# Patient Record
Sex: Female | Born: 1970 | Race: White | Hispanic: No | Smoking: Current every day smoker
Health system: Southern US, Community
[De-identification: ages and names within clinical notes are randomized; demographics above are authoritative.]

## PROBLEM LIST (undated history)

## (undated) ENCOUNTER — Emergency Department (HOSPITAL_COMMUNITY): Disposition: A | Discharge status: Home Health | Payer: Medicare Other

## (undated) ENCOUNTER — Inpatient Hospital Stay (HOSPITAL_COMMUNITY): Payer: Self-pay

## (undated) DIAGNOSIS — F419 Anxiety disorder, unspecified: Secondary | ICD-10-CM

## (undated) DIAGNOSIS — B169 Acute hepatitis B without delta-agent and without hepatic coma: Secondary | ICD-10-CM

## (undated) DIAGNOSIS — IMO0002 Reserved for concepts with insufficient information to code with codable children: Secondary | ICD-10-CM

## (undated) DIAGNOSIS — B1710 Acute hepatitis C without hepatic coma: Secondary | ICD-10-CM

## (undated) DIAGNOSIS — J449 Chronic obstructive pulmonary disease, unspecified: Secondary | ICD-10-CM

## (undated) HISTORY — PX: BREAST SURGERY: SHX581

---

## 1997-10-22 ENCOUNTER — Inpatient Hospital Stay (HOSPITAL_COMMUNITY): Admission: AD | Admit: 1997-10-22 | Discharge: 1997-10-22 | Payer: Self-pay | Admitting: *Deleted

## 1998-06-09 ENCOUNTER — Emergency Department (HOSPITAL_COMMUNITY): Admission: EM | Admit: 1998-06-09 | Discharge: 1998-06-09 | Payer: Self-pay | Admitting: Emergency Medicine

## 1998-06-09 ENCOUNTER — Encounter: Payer: Self-pay | Admitting: Emergency Medicine

## 1998-06-09 ENCOUNTER — Ambulatory Visit (HOSPITAL_COMMUNITY): Admission: RE | Admit: 1998-06-09 | Discharge: 1998-06-09 | Payer: Self-pay

## 1998-06-22 ENCOUNTER — Encounter: Payer: Self-pay | Admitting: Emergency Medicine

## 1998-06-22 ENCOUNTER — Inpatient Hospital Stay (HOSPITAL_COMMUNITY): Admission: EM | Admit: 1998-06-22 | Discharge: 1998-06-23 | Payer: Self-pay | Admitting: Emergency Medicine

## 1998-09-08 ENCOUNTER — Emergency Department (HOSPITAL_COMMUNITY): Admission: EM | Admit: 1998-09-08 | Discharge: 1998-09-08 | Payer: Self-pay | Admitting: Emergency Medicine

## 1998-10-29 ENCOUNTER — Inpatient Hospital Stay (HOSPITAL_COMMUNITY): Admission: EM | Admit: 1998-10-29 | Discharge: 1998-11-03 | Payer: Self-pay | Admitting: Emergency Medicine

## 1999-01-25 ENCOUNTER — Observation Stay (HOSPITAL_COMMUNITY): Admission: EM | Admit: 1999-01-25 | Discharge: 1999-01-25 | Payer: Self-pay | Admitting: *Deleted

## 2000-02-03 ENCOUNTER — Emergency Department (HOSPITAL_COMMUNITY): Admission: EM | Admit: 2000-02-03 | Discharge: 2000-02-03 | Payer: Self-pay | Admitting: Emergency Medicine

## 2001-03-11 ENCOUNTER — Other Ambulatory Visit: Admission: RE | Admit: 2001-03-11 | Discharge: 2001-03-11 | Payer: Self-pay | Admitting: Obstetrics and Gynecology

## 2001-03-12 ENCOUNTER — Other Ambulatory Visit: Admission: RE | Admit: 2001-03-12 | Discharge: 2001-03-12 | Payer: Self-pay | Admitting: Obstetrics and Gynecology

## 2001-03-28 ENCOUNTER — Encounter: Payer: Self-pay | Admitting: Obstetrics and Gynecology

## 2001-03-28 ENCOUNTER — Inpatient Hospital Stay (HOSPITAL_COMMUNITY): Admission: AD | Admit: 2001-03-28 | Discharge: 2001-03-28 | Payer: Self-pay | Admitting: Obstetrics and Gynecology

## 2001-06-26 ENCOUNTER — Encounter: Payer: Self-pay | Admitting: Obstetrics and Gynecology

## 2001-06-26 ENCOUNTER — Inpatient Hospital Stay (HOSPITAL_COMMUNITY): Admission: AD | Admit: 2001-06-26 | Discharge: 2001-06-26 | Payer: Self-pay | Admitting: Obstetrics and Gynecology

## 2001-08-25 ENCOUNTER — Inpatient Hospital Stay (HOSPITAL_COMMUNITY): Admission: AD | Admit: 2001-08-25 | Discharge: 2001-08-25 | Payer: Self-pay | Admitting: Obstetrics & Gynecology

## 2001-09-18 ENCOUNTER — Inpatient Hospital Stay (HOSPITAL_COMMUNITY): Admission: AD | Admit: 2001-09-18 | Discharge: 2001-09-21 | Payer: Self-pay | Admitting: Obstetrics and Gynecology

## 2001-09-25 ENCOUNTER — Inpatient Hospital Stay (HOSPITAL_COMMUNITY): Admission: AD | Admit: 2001-09-25 | Discharge: 2001-09-25 | Payer: Self-pay | Admitting: Family Medicine

## 2001-10-15 ENCOUNTER — Other Ambulatory Visit: Admission: RE | Admit: 2001-10-15 | Discharge: 2001-10-15 | Payer: Self-pay | Admitting: Obstetrics and Gynecology

## 2002-01-17 ENCOUNTER — Inpatient Hospital Stay (HOSPITAL_COMMUNITY): Admission: AD | Admit: 2002-01-17 | Discharge: 2002-01-18 | Payer: Self-pay | Admitting: *Deleted

## 2002-01-27 ENCOUNTER — Emergency Department (HOSPITAL_COMMUNITY): Admission: EM | Admit: 2002-01-27 | Discharge: 2002-01-27 | Payer: Self-pay | Admitting: Emergency Medicine

## 2002-03-26 ENCOUNTER — Emergency Department (HOSPITAL_COMMUNITY): Admission: EM | Admit: 2002-03-26 | Discharge: 2002-03-27 | Payer: Self-pay | Admitting: Emergency Medicine

## 2002-03-27 ENCOUNTER — Encounter: Payer: Self-pay | Admitting: Emergency Medicine

## 2002-09-07 ENCOUNTER — Encounter: Payer: Self-pay | Admitting: Obstetrics and Gynecology

## 2002-09-07 ENCOUNTER — Inpatient Hospital Stay (HOSPITAL_COMMUNITY): Admission: AD | Admit: 2002-09-07 | Discharge: 2002-09-07 | Payer: Self-pay | Admitting: Obstetrics and Gynecology

## 2002-09-25 ENCOUNTER — Emergency Department (HOSPITAL_COMMUNITY): Admission: EM | Admit: 2002-09-25 | Discharge: 2002-09-25 | Payer: Self-pay | Admitting: Emergency Medicine

## 2002-09-27 ENCOUNTER — Inpatient Hospital Stay (HOSPITAL_COMMUNITY): Admission: AD | Admit: 2002-09-27 | Discharge: 2002-09-27 | Payer: Self-pay | Admitting: Psychiatry

## 2002-10-24 ENCOUNTER — Emergency Department (HOSPITAL_COMMUNITY): Admission: AC | Admit: 2002-10-24 | Discharge: 2002-10-24 | Payer: Self-pay

## 2002-10-24 ENCOUNTER — Encounter: Payer: Self-pay | Admitting: Emergency Medicine

## 2003-09-14 ENCOUNTER — Emergency Department (HOSPITAL_COMMUNITY): Admission: EM | Admit: 2003-09-14 | Discharge: 2003-09-14 | Payer: Self-pay | Admitting: Emergency Medicine

## 2004-01-11 ENCOUNTER — Inpatient Hospital Stay (HOSPITAL_COMMUNITY): Admission: EM | Admit: 2004-01-11 | Discharge: 2004-01-13 | Payer: Self-pay | Admitting: Psychiatry

## 2004-01-11 ENCOUNTER — Ambulatory Visit: Payer: Self-pay | Admitting: Psychiatry

## 2004-01-12 ENCOUNTER — Encounter: Payer: Self-pay | Admitting: Emergency Medicine

## 2004-03-11 ENCOUNTER — Emergency Department (HOSPITAL_COMMUNITY): Admission: EM | Admit: 2004-03-11 | Discharge: 2004-03-11 | Payer: Self-pay | Admitting: Emergency Medicine

## 2004-03-19 ENCOUNTER — Ambulatory Visit (HOSPITAL_COMMUNITY): Admission: AD | Admit: 2004-03-19 | Discharge: 2004-03-19 | Payer: Self-pay | Admitting: Obstetrics and Gynecology

## 2004-03-19 ENCOUNTER — Encounter (INDEPENDENT_AMBULATORY_CARE_PROVIDER_SITE_OTHER): Payer: Self-pay | Admitting: Specialist

## 2004-03-22 ENCOUNTER — Inpatient Hospital Stay (HOSPITAL_COMMUNITY): Admission: AD | Admit: 2004-03-22 | Discharge: 2004-03-22 | Payer: Self-pay | Admitting: Obstetrics and Gynecology

## 2004-08-11 ENCOUNTER — Inpatient Hospital Stay (HOSPITAL_COMMUNITY): Admission: EM | Admit: 2004-08-11 | Discharge: 2004-08-19 | Payer: Self-pay | Admitting: Emergency Medicine

## 2004-08-13 ENCOUNTER — Ambulatory Visit: Payer: Self-pay | Admitting: Internal Medicine

## 2004-08-21 ENCOUNTER — Emergency Department (HOSPITAL_COMMUNITY): Admission: EM | Admit: 2004-08-21 | Discharge: 2004-08-22 | Payer: Self-pay | Admitting: Emergency Medicine

## 2004-09-13 ENCOUNTER — Emergency Department (HOSPITAL_COMMUNITY): Admission: EM | Admit: 2004-09-13 | Discharge: 2004-09-13 | Payer: Self-pay | Admitting: Emergency Medicine

## 2004-11-18 ENCOUNTER — Emergency Department (HOSPITAL_COMMUNITY): Admission: EM | Admit: 2004-11-18 | Discharge: 2004-11-18 | Payer: Self-pay | Admitting: Emergency Medicine

## 2005-01-12 ENCOUNTER — Emergency Department (HOSPITAL_COMMUNITY): Admission: EM | Admit: 2005-01-12 | Discharge: 2005-01-13 | Payer: Self-pay | Admitting: Emergency Medicine

## 2005-01-12 ENCOUNTER — Emergency Department: Payer: Self-pay | Admitting: Unknown Physician Specialty

## 2005-01-14 ENCOUNTER — Emergency Department (HOSPITAL_COMMUNITY): Admission: EM | Admit: 2005-01-14 | Discharge: 2005-01-14 | Payer: Self-pay | Admitting: *Deleted

## 2005-02-15 ENCOUNTER — Emergency Department (HOSPITAL_COMMUNITY): Admission: EM | Admit: 2005-02-15 | Discharge: 2005-02-16 | Payer: Self-pay | Admitting: Emergency Medicine

## 2005-05-15 ENCOUNTER — Emergency Department (HOSPITAL_COMMUNITY): Admission: EM | Admit: 2005-05-15 | Discharge: 2005-05-15 | Payer: Self-pay | Admitting: Emergency Medicine

## 2005-06-12 ENCOUNTER — Encounter: Payer: Self-pay | Admitting: Emergency Medicine

## 2005-06-19 ENCOUNTER — Emergency Department (HOSPITAL_COMMUNITY): Admission: EM | Admit: 2005-06-19 | Discharge: 2005-06-19 | Payer: Self-pay | Admitting: *Deleted

## 2005-07-18 ENCOUNTER — Inpatient Hospital Stay (HOSPITAL_COMMUNITY): Admission: AD | Admit: 2005-07-18 | Discharge: 2005-07-18 | Payer: Self-pay | Admitting: Obstetrics and Gynecology

## 2005-07-25 ENCOUNTER — Emergency Department (HOSPITAL_COMMUNITY): Admission: EM | Admit: 2005-07-25 | Discharge: 2005-07-25 | Payer: Self-pay | Admitting: Emergency Medicine

## 2005-07-27 ENCOUNTER — Emergency Department (HOSPITAL_COMMUNITY): Admission: EM | Admit: 2005-07-27 | Discharge: 2005-07-28 | Payer: Self-pay | Admitting: Emergency Medicine

## 2005-08-18 ENCOUNTER — Inpatient Hospital Stay (HOSPITAL_COMMUNITY): Admission: AD | Admit: 2005-08-18 | Discharge: 2005-08-18 | Payer: Self-pay | Admitting: Obstetrics and Gynecology

## 2005-08-28 ENCOUNTER — Ambulatory Visit (HOSPITAL_COMMUNITY): Admission: RE | Admit: 2005-08-28 | Discharge: 2005-08-28 | Payer: Self-pay | Admitting: Obstetrics and Gynecology

## 2005-09-13 ENCOUNTER — Inpatient Hospital Stay (HOSPITAL_COMMUNITY): Admission: AD | Admit: 2005-09-13 | Discharge: 2005-09-13 | Payer: Self-pay | Admitting: Obstetrics and Gynecology

## 2006-04-08 ENCOUNTER — Inpatient Hospital Stay (HOSPITAL_COMMUNITY): Admission: AD | Admit: 2006-04-08 | Discharge: 2006-04-11 | Payer: Self-pay | Admitting: Psychiatry

## 2006-04-08 ENCOUNTER — Ambulatory Visit: Payer: Self-pay | Admitting: Psychiatry

## 2006-12-24 ENCOUNTER — Emergency Department (HOSPITAL_COMMUNITY): Admission: EM | Admit: 2006-12-24 | Discharge: 2006-12-24 | Payer: Self-pay | Admitting: Emergency Medicine

## 2007-01-23 ENCOUNTER — Emergency Department (HOSPITAL_COMMUNITY): Admission: EM | Admit: 2007-01-23 | Discharge: 2007-01-23 | Payer: Self-pay | Admitting: Emergency Medicine

## 2007-04-09 ENCOUNTER — Emergency Department (HOSPITAL_COMMUNITY): Admission: EM | Admit: 2007-04-09 | Discharge: 2007-04-09 | Payer: Self-pay | Admitting: Emergency Medicine

## 2007-08-11 ENCOUNTER — Inpatient Hospital Stay (HOSPITAL_COMMUNITY): Admission: EM | Admit: 2007-08-11 | Discharge: 2007-08-12 | Payer: Self-pay | Admitting: Psychiatry

## 2007-08-11 ENCOUNTER — Ambulatory Visit: Payer: Self-pay | Admitting: Psychiatry

## 2007-12-27 ENCOUNTER — Emergency Department (HOSPITAL_COMMUNITY): Admission: EM | Admit: 2007-12-27 | Discharge: 2007-12-28 | Payer: Self-pay | Admitting: Emergency Medicine

## 2007-12-31 ENCOUNTER — Emergency Department (HOSPITAL_COMMUNITY): Admission: EM | Admit: 2007-12-31 | Discharge: 2007-12-31 | Payer: Self-pay | Admitting: Emergency Medicine

## 2008-01-04 ENCOUNTER — Emergency Department (HOSPITAL_COMMUNITY): Admission: EM | Admit: 2008-01-04 | Discharge: 2008-01-04 | Payer: Self-pay | Admitting: Emergency Medicine

## 2008-01-20 ENCOUNTER — Ambulatory Visit: Payer: Self-pay | Admitting: Gastroenterology

## 2008-01-20 DIAGNOSIS — R1011 Right upper quadrant pain: Secondary | ICD-10-CM

## 2008-01-20 DIAGNOSIS — K219 Gastro-esophageal reflux disease without esophagitis: Secondary | ICD-10-CM

## 2008-01-20 DIAGNOSIS — B1910 Unspecified viral hepatitis B without hepatic coma: Secondary | ICD-10-CM | POA: Insufficient documentation

## 2008-01-20 DIAGNOSIS — D649 Anemia, unspecified: Secondary | ICD-10-CM

## 2008-01-21 ENCOUNTER — Encounter: Payer: Self-pay | Admitting: Gastroenterology

## 2008-01-21 LAB — CONVERTED CEMR LAB
Ferritin: 73.6 ng/mL (ref 10.0–291.0)
Folate: 10.1 ng/mL
HCV Ab: NEGATIVE
Hepatitis B Surface Ag: POSITIVE — AB
Iron: 119 ug/dL (ref 42–145)
Saturation Ratios: 35 % (ref 20.0–50.0)
Vitamin B-12: 210 pg/mL — ABNORMAL LOW (ref 211–911)

## 2008-01-26 ENCOUNTER — Ambulatory Visit: Payer: Self-pay | Admitting: Gastroenterology

## 2008-01-26 LAB — CONVERTED CEMR LAB: Hep B E Ag: NEGATIVE

## 2008-02-24 ENCOUNTER — Ambulatory Visit: Payer: Self-pay | Admitting: Gastroenterology

## 2008-02-24 ENCOUNTER — Telehealth (INDEPENDENT_AMBULATORY_CARE_PROVIDER_SITE_OTHER): Payer: Self-pay

## 2008-02-24 ENCOUNTER — Encounter: Payer: Self-pay | Admitting: Gastroenterology

## 2008-02-24 DIAGNOSIS — R110 Nausea: Secondary | ICD-10-CM

## 2008-02-26 ENCOUNTER — Encounter: Payer: Self-pay | Admitting: Gastroenterology

## 2008-03-04 ENCOUNTER — Ambulatory Visit (HOSPITAL_COMMUNITY): Admission: RE | Admit: 2008-03-04 | Discharge: 2008-03-04 | Payer: Self-pay | Admitting: Gastroenterology

## 2008-03-04 ENCOUNTER — Telehealth: Payer: Self-pay | Admitting: Gastroenterology

## 2008-03-07 ENCOUNTER — Ambulatory Visit (HOSPITAL_COMMUNITY): Admission: RE | Admit: 2008-03-07 | Discharge: 2008-03-07 | Payer: Self-pay | Admitting: Gastroenterology

## 2008-03-08 ENCOUNTER — Telehealth: Payer: Self-pay | Admitting: Gastroenterology

## 2008-03-29 ENCOUNTER — Ambulatory Visit: Payer: Self-pay | Admitting: Gastroenterology

## 2008-05-09 ENCOUNTER — Emergency Department (HOSPITAL_COMMUNITY): Admission: EM | Admit: 2008-05-09 | Discharge: 2008-05-09 | Payer: Self-pay | Admitting: Emergency Medicine

## 2008-05-11 ENCOUNTER — Emergency Department (HOSPITAL_COMMUNITY): Admission: EM | Admit: 2008-05-11 | Discharge: 2008-05-11 | Payer: Self-pay | Admitting: Emergency Medicine

## 2008-05-27 ENCOUNTER — Emergency Department (HOSPITAL_COMMUNITY): Admission: EM | Admit: 2008-05-27 | Discharge: 2008-05-28 | Payer: Self-pay | Admitting: Emergency Medicine

## 2008-08-28 ENCOUNTER — Emergency Department (HOSPITAL_COMMUNITY): Admission: EM | Admit: 2008-08-28 | Discharge: 2008-08-28 | Payer: Self-pay | Admitting: Emergency Medicine

## 2008-11-06 ENCOUNTER — Emergency Department (HOSPITAL_COMMUNITY): Admission: EM | Admit: 2008-11-06 | Discharge: 2008-11-07 | Payer: Self-pay | Admitting: Emergency Medicine

## 2008-11-29 ENCOUNTER — Encounter: Admission: RE | Admit: 2008-11-29 | Discharge: 2008-11-29 | Payer: Self-pay | Admitting: Orthopedic Surgery

## 2008-12-10 ENCOUNTER — Emergency Department (HOSPITAL_COMMUNITY): Admission: EM | Admit: 2008-12-10 | Discharge: 2008-12-10 | Payer: Self-pay | Admitting: Emergency Medicine

## 2009-02-21 ENCOUNTER — Emergency Department (HOSPITAL_COMMUNITY): Admission: EM | Admit: 2009-02-21 | Discharge: 2009-02-21 | Payer: Self-pay | Admitting: Emergency Medicine

## 2009-06-11 ENCOUNTER — Emergency Department (HOSPITAL_COMMUNITY): Admission: EM | Admit: 2009-06-11 | Discharge: 2009-06-12 | Payer: Self-pay | Admitting: Emergency Medicine

## 2009-09-28 ENCOUNTER — Emergency Department (HOSPITAL_COMMUNITY): Admission: EM | Admit: 2009-09-28 | Discharge: 2009-09-29 | Payer: Self-pay | Admitting: Emergency Medicine

## 2009-11-09 ENCOUNTER — Ambulatory Visit: Payer: Self-pay | Admitting: Gynecology

## 2009-11-09 ENCOUNTER — Inpatient Hospital Stay (HOSPITAL_COMMUNITY): Admission: AD | Admit: 2009-11-09 | Discharge: 2009-11-09 | Payer: Self-pay | Admitting: Obstetrics and Gynecology

## 2009-12-06 ENCOUNTER — Ambulatory Visit: Payer: Self-pay | Admitting: Gynecology

## 2009-12-06 ENCOUNTER — Inpatient Hospital Stay (HOSPITAL_COMMUNITY): Admission: AD | Admit: 2009-12-06 | Discharge: 2009-12-06 | Payer: Self-pay | Admitting: Family Medicine

## 2010-01-15 ENCOUNTER — Inpatient Hospital Stay (HOSPITAL_COMMUNITY)
Admission: EM | Admit: 2010-01-15 | Discharge: 2010-01-18 | Disposition: A | Discharge status: Other Acute Inpatient Hospital | Payer: Self-pay | Source: Home / Self Care | Attending: Internal Medicine | Admitting: Internal Medicine

## 2010-01-18 ENCOUNTER — Inpatient Hospital Stay (HOSPITAL_COMMUNITY)
Admission: EM | Admit: 2010-01-18 | Discharge: 2010-01-24 | Payer: Self-pay | Source: Home / Self Care | Attending: Psychiatry | Admitting: Psychiatry

## 2010-01-18 DIAGNOSIS — F331 Major depressive disorder, recurrent, moderate: Secondary | ICD-10-CM

## 2010-03-04 ENCOUNTER — Encounter: Payer: Self-pay | Admitting: Obstetrics and Gynecology

## 2010-04-10 ENCOUNTER — Other Ambulatory Visit (HOSPITAL_COMMUNITY): Payer: Self-pay | Admitting: Orthopedic Surgery

## 2010-04-10 DIAGNOSIS — M545 Low back pain: Secondary | ICD-10-CM

## 2010-04-10 DIAGNOSIS — M542 Cervicalgia: Secondary | ICD-10-CM

## 2010-04-20 ENCOUNTER — Encounter (HOSPITAL_COMMUNITY): Payer: Self-pay

## 2010-04-20 ENCOUNTER — Ambulatory Visit (HOSPITAL_COMMUNITY): Payer: Self-pay

## 2010-04-24 LAB — BASIC METABOLIC PANEL
BUN: 4 mg/dL — ABNORMAL LOW (ref 6–23)
BUN: 7 mg/dL (ref 6–23)
Chloride: 111 mEq/L (ref 96–112)
GFR calc non Af Amer: >60 mL/min (ref >60)
Glucose, Bld: 126 mg/dL — ABNORMAL HIGH (ref 70–99)
Potassium: 3.7 mEq/L (ref 3.5–5.1)
Sodium: 146 mEq/L — ABNORMAL HIGH (ref 135–145)

## 2010-04-24 LAB — COMPREHENSIVE METABOLIC PANEL
ALT: 10 U/L (ref 0–35)
ALT: 10 U/L (ref 0–35)
ALT: 11 U/L (ref 0–35)
ALT: 13 U/L (ref 0–35)
ALT: 8 U/L (ref 0–35)
ALT: 8 U/L (ref 0–35)
ALT: 9 U/L (ref 0–35)
AST: 11 U/L (ref 0–37)
AST: 12 U/L (ref 0–37)
AST: 12 U/L (ref 0–37)
AST: 13 U/L (ref 0–37)
AST: 14 U/L (ref 0–37)
AST: 9 U/L (ref 0–37)
Albumin: 2.8 g/dL — ABNORMAL LOW (ref 3.5–5.2)
Albumin: 2.8 g/dL — ABNORMAL LOW (ref 3.5–5.2)
Albumin: 2.9 g/dL — ABNORMAL LOW (ref 3.5–5.2)
Albumin: 3.1 g/dL — ABNORMAL LOW (ref 3.5–5.2)
Albumin: 3.2 g/dL — ABNORMAL LOW (ref 3.5–5.2)
Albumin: 3.2 g/dL — ABNORMAL LOW (ref 3.5–5.2)
Albumin: 3.4 g/dL — ABNORMAL LOW (ref 3.5–5.2)
Alkaline Phosphatase: 38 U/L — ABNORMAL LOW (ref 39–117)
Alkaline Phosphatase: 40 U/L (ref 39–117)
Alkaline Phosphatase: 40 U/L (ref 39–117)
Alkaline Phosphatase: 40 U/L (ref 39–117)
Alkaline Phosphatase: 41 U/L (ref 39–117)
BUN: 2 mg/dL — ABNORMAL LOW (ref 6–23)
BUN: 3 mg/dL — ABNORMAL LOW (ref 6–23)
BUN: 3 mg/dL — ABNORMAL LOW (ref 6–23)
BUN: 3 mg/dL — ABNORMAL LOW (ref 6–23)
BUN: 4 mg/dL — ABNORMAL LOW (ref 6–23)
CO2: 22 mEq/L (ref 19–32)
CO2: 22 mEq/L (ref 19–32)
CO2: 24 mEq/L (ref 19–32)
CO2: 31 mEq/L (ref 19–32)
Calcium: 8.3 mg/dL — ABNORMAL LOW (ref 8.4–10.5)
Calcium: 8.6 mg/dL (ref 8.4–10.5)
Calcium: 8.7 mg/dL (ref 8.4–10.5)
Calcium: 8.8 mg/dL (ref 8.4–10.5)
Calcium: 8.9 mg/dL (ref 8.4–10.5)
Chloride: 112 mEq/L (ref 96–112)
Chloride: 113 mEq/L — ABNORMAL HIGH (ref 96–112)
Chloride: 113 mEq/L — ABNORMAL HIGH (ref 96–112)
Chloride: 116 mEq/L — ABNORMAL HIGH (ref 96–112)
Creatinine, Ser: 0.43 mg/dL (ref 0.4–1.2)
Creatinine, Ser: 0.55 mg/dL (ref 0.4–1.2)
Creatinine, Ser: 0.68 mg/dL (ref 0.4–1.2)
Creatinine, Ser: 0.72 mg/dL (ref 0.4–1.2)
GFR calc Af Amer: >60 mL/min (ref >60)
GFR calc Af Amer: >60 mL/min (ref >60)
GFR calc Af Amer: >60 mL/min (ref >60)
GFR calc Af Amer: >60 mL/min (ref >60)
GFR calc Af Amer: >60 mL/min (ref >60)
GFR calc Af Amer: >60 mL/min (ref >60)
GFR calc non Af Amer: >60 mL/min (ref >60)
GFR calc non Af Amer: >60 mL/min (ref >60)
GFR calc non Af Amer: >60 mL/min (ref >60)
Glucose, Bld: 108 mg/dL — ABNORMAL HIGH (ref 70–99)
Glucose, Bld: 119 mg/dL — ABNORMAL HIGH (ref 70–99)
Glucose, Bld: 93 mg/dL (ref 70–99)
Potassium: 2.9 mEq/L — ABNORMAL LOW (ref 3.5–5.1)
Potassium: 3.2 mEq/L — ABNORMAL LOW (ref 3.5–5.1)
Potassium: 3.7 mEq/L (ref 3.5–5.1)
Potassium: 3.8 mEq/L (ref 3.5–5.1)
Sodium: 141 mEq/L (ref 135–145)
Sodium: 142 mEq/L (ref 135–145)
Sodium: 143 mEq/L (ref 135–145)
Sodium: 143 mEq/L (ref 135–145)
Sodium: 144 mEq/L (ref 135–145)
Sodium: 144 mEq/L (ref 135–145)
Sodium: 145 mEq/L (ref 135–145)
Total Bilirubin: 0.4 mg/dL (ref 0.3–1.2)
Total Bilirubin: 0.4 mg/dL (ref 0.3–1.2)
Total Bilirubin: 0.4 mg/dL (ref 0.3–1.2)
Total Bilirubin: 0.5 mg/dL (ref 0.3–1.2)
Total Bilirubin: 0.5 mg/dL (ref 0.3–1.2)
Total Bilirubin: 0.6 mg/dL (ref 0.3–1.2)
Total Protein: 4.5 g/dL — ABNORMAL LOW (ref 6.0–8.3)
Total Protein: 4.6 g/dL — ABNORMAL LOW (ref 6.0–8.3)
Total Protein: 4.7 g/dL — ABNORMAL LOW (ref 6.0–8.3)
Total Protein: 5.1 g/dL — ABNORMAL LOW (ref 6.0–8.3)
Total Protein: 5.3 g/dL — ABNORMAL LOW (ref 6.0–8.3)
Total Protein: 5.3 g/dL — ABNORMAL LOW (ref 6.0–8.3)
Total Protein: 5.5 g/dL — ABNORMAL LOW (ref 6.0–8.3)
Total Protein: 5.8 g/dL — ABNORMAL LOW (ref 6.0–8.3)

## 2010-04-24 LAB — URINALYSIS, ROUTINE W REFLEX MICROSCOPIC
Bilirubin Urine: NEGATIVE
Glucose, UA: NEGATIVE mg/dL
Ketones, ur: NEGATIVE mg/dL
Protein, ur: NEGATIVE mg/dL

## 2010-04-24 LAB — APTT
aPTT: 35 seconds (ref 24–37)
aPTT: 35 seconds (ref 24–37)

## 2010-04-24 LAB — HEPATIC FUNCTION PANEL
ALT: 12 U/L (ref 0–35)
Albumin: 3.5 g/dL (ref 3.5–5.2)
Alkaline Phosphatase: 42 U/L (ref 39–117)
Bilirubin, Direct: <0.1 mg/dL (ref 0.0–0.3)
Total Bilirubin: 0.8 mg/dL (ref 0.3–1.2)
Total Protein: 5.8 g/dL — ABNORMAL LOW (ref 6.0–8.3)

## 2010-04-24 LAB — RAPID URINE DRUG SCREEN, HOSP PERFORMED
Amphetamines: NOT DETECTED
Barbiturates: NOT DETECTED
Benzodiazepines: POSITIVE — AB
Opiates: NOT DETECTED
Tetrahydrocannabinol: NOT DETECTED

## 2010-04-24 LAB — HEPATITIS B DNA, ULTRAQUANTITATIVE, PCR
Hepatitis B DNA (Calc): 104760 copies/mL — ABNORMAL HIGH (ref <116)
Hepatitis B DNA: 18000 IU/mL — ABNORMAL HIGH (ref <20)

## 2010-04-24 LAB — PROTIME-INR
INR: 1.05 (ref 0.00–1.49)
INR: 1.37 (ref 0.00–1.49)
Prothrombin Time: 13.9 seconds (ref 11.6–15.2)
Prothrombin Time: 14.5 seconds (ref 11.6–15.2)
Prothrombin Time: 15.4 seconds — ABNORMAL HIGH (ref 11.6–15.2)

## 2010-04-24 LAB — CBC
HCT: 38.8 % (ref 36.0–46.0)
HCT: 40.3 % (ref 36.0–46.0)
Hemoglobin: 13.2 g/dL (ref 12.0–15.0)
Hemoglobin: 13.9 g/dL (ref 12.0–15.0)
MCH: 31.5 pg (ref 26.0–34.0)
MCH: 32.6 pg (ref 26.0–34.0)
MCHC: 33.1 g/dL (ref 30.0–36.0)
MCV: 94.6 fL (ref 78.0–100.0)
MCV: 95 fL (ref 78.0–100.0)
Platelets: 194 10*3/uL (ref 150–400)
RBC: 4.14 MIL/uL (ref 3.87–5.11)
RDW: 13.7 % (ref 11.5–15.5)
RDW: 13.8 % (ref 11.5–15.5)
RDW: 13.9 % (ref 11.5–15.5)
WBC: 6.1 10*3/uL (ref 4.0–10.5)

## 2010-04-24 LAB — DIFFERENTIAL
Basophils Relative: 0 % (ref 0–1)
Lymphocytes Relative: 44 % (ref 12–46)
Lymphs Abs: 3.7 10*3/uL (ref 0.7–4.0)
Monocytes Absolute: 0.4 10*3/uL (ref 0.1–1.0)
Neutrophils Relative %: 50 % (ref 43–77)

## 2010-04-24 LAB — MRSA PCR SCREENING: MRSA by PCR: NEGATIVE

## 2010-04-24 LAB — LIPASE, BLOOD: Lipase: 30 U/L (ref 11–59)

## 2010-04-25 LAB — COMPREHENSIVE METABOLIC PANEL
AST: 13 U/L (ref 0–37)
Alkaline Phosphatase: 50 U/L (ref 39–117)
BUN: 4 mg/dL — ABNORMAL LOW (ref 6–23)
CO2: 27 mEq/L (ref 19–32)
Chloride: 105 mEq/L (ref 96–112)
Creatinine, Ser: 0.54 mg/dL (ref 0.4–1.2)
GFR calc Af Amer: >60 mL/min (ref >60)
GFR calc non Af Amer: >60 mL/min (ref >60)
Total Bilirubin: 0.4 mg/dL (ref 0.3–1.2)

## 2010-04-25 LAB — DIFFERENTIAL
Basophils Absolute: 0.1 10*3/uL (ref 0.0–0.1)
Basophils Relative: 1 % (ref 0–1)
Eosinophils Relative: 2 % (ref 0–5)
Lymphocytes Relative: 28 % (ref 12–46)

## 2010-04-25 LAB — CBC
Hemoglobin: 14.5 g/dL (ref 12.0–15.0)
MCH: 33 pg (ref 26.0–34.0)
MCV: 97.4 fL (ref 78.0–100.0)
RBC: 4.41 MIL/uL (ref 3.87–5.11)

## 2010-04-25 LAB — URINALYSIS, ROUTINE W REFLEX MICROSCOPIC
Glucose, UA: NEGATIVE mg/dL
Specific Gravity, Urine: 1.01 (ref 1.005–1.030)

## 2010-04-25 LAB — URINE MICROSCOPIC-ADD ON

## 2010-04-25 LAB — WET PREP, GENITAL: Yeast Wet Prep HPF POC: NONE SEEN

## 2010-04-25 LAB — RAPID URINE DRUG SCREEN, HOSP PERFORMED
Barbiturates: NOT DETECTED
Opiates: NOT DETECTED

## 2010-04-26 LAB — CBC
MCHC: 34.8 g/dL (ref 30.0–36.0)
Platelets: 257 10*3/uL (ref 150–400)
RDW: 13 % (ref 11.5–15.5)
WBC: 6.2 10*3/uL (ref 4.0–10.5)

## 2010-04-26 LAB — URINE MICROSCOPIC-ADD ON

## 2010-04-26 LAB — URINALYSIS, ROUTINE W REFLEX MICROSCOPIC
Glucose, UA: NEGATIVE mg/dL
Specific Gravity, Urine: <1.005 — ABNORMAL LOW (ref 1.005–1.030)
pH: 5.5 (ref 5.0–8.0)

## 2010-04-26 LAB — WET PREP, GENITAL: Trich, Wet Prep: NONE SEEN

## 2010-04-26 LAB — POCT PREGNANCY, URINE: Preg Test, Ur: NEGATIVE

## 2010-04-27 LAB — URINALYSIS, ROUTINE W REFLEX MICROSCOPIC
Hgb urine dipstick: NEGATIVE
Ketones, ur: NEGATIVE mg/dL
Protein, ur: NEGATIVE mg/dL
Urobilinogen, UA: 0.2 mg/dL (ref 0.0–1.0)

## 2010-04-27 LAB — BASIC METABOLIC PANEL
BUN: 4 mg/dL — ABNORMAL LOW (ref 6–23)
Chloride: 105 mEq/L (ref 96–112)
Creatinine, Ser: 0.65 mg/dL (ref 0.4–1.2)
Glucose, Bld: 86 mg/dL (ref 70–99)
Potassium: 3.3 mEq/L — ABNORMAL LOW (ref 3.5–5.1)

## 2010-04-27 LAB — RAPID URINE DRUG SCREEN, HOSP PERFORMED
Amphetamines: NOT DETECTED
Benzodiazepines: POSITIVE — AB
Cocaine: NOT DETECTED

## 2010-04-27 LAB — CBC
HCT: 37.3 % (ref 36.0–46.0)
MCHC: 34.8 g/dL (ref 30.0–36.0)
MCV: 99.3 fL (ref 78.0–100.0)
Platelets: 274 10*3/uL (ref 150–400)
RDW: 13.5 % (ref 11.5–15.5)
WBC: 8.4 10*3/uL (ref 4.0–10.5)

## 2010-04-27 LAB — HEPATIC FUNCTION PANEL
Alkaline Phosphatase: 50 U/L (ref 39–117)
Total Bilirubin: 0.5 mg/dL (ref 0.3–1.2)
Total Protein: 5.8 g/dL — ABNORMAL LOW (ref 6.0–8.3)

## 2010-04-27 LAB — DIFFERENTIAL
Basophils Absolute: 0.1 10*3/uL (ref 0.0–0.1)
Basophils Relative: 1 % (ref 0–1)
Eosinophils Absolute: 0.1 10*3/uL (ref 0.0–0.7)
Eosinophils Relative: 1 % (ref 0–5)
Lymphocytes Relative: 34 % (ref 12–46)
Monocytes Absolute: 0.4 10*3/uL (ref 0.1–1.0)

## 2010-04-27 LAB — POCT PREGNANCY, URINE: Preg Test, Ur: NEGATIVE

## 2010-04-27 LAB — ETHANOL: Alcohol, Ethyl (B): <5 mg/dL (ref 0–10)

## 2010-04-29 LAB — COMPREHENSIVE METABOLIC PANEL
ALT: 22 U/L (ref 0–35)
AST: 31 U/L (ref 0–37)
Albumin: 4.4 g/dL (ref 3.5–5.2)
Alkaline Phosphatase: 75 U/L (ref 39–117)
BUN: 4 mg/dL — ABNORMAL LOW (ref 6–23)
GFR calc Af Amer: >60 mL/min (ref >60)
Potassium: 3.3 mEq/L — ABNORMAL LOW (ref 3.5–5.1)
Sodium: 135 mEq/L (ref 135–145)
Total Protein: 7.6 g/dL (ref 6.0–8.3)

## 2010-04-29 LAB — DIFFERENTIAL
Basophils Relative: 0 % (ref 0–1)
Eosinophils Relative: 2 % (ref 0–5)
Monocytes Absolute: 0.9 10*3/uL (ref 0.1–1.0)
Monocytes Relative: 10 % (ref 3–12)
Neutro Abs: 5.3 10*3/uL (ref 1.7–7.7)

## 2010-04-29 LAB — URINALYSIS, ROUTINE W REFLEX MICROSCOPIC
Ketones, ur: NEGATIVE mg/dL
Nitrite: NEGATIVE
Protein, ur: NEGATIVE mg/dL

## 2010-04-29 LAB — POCT CARDIAC MARKERS: Myoglobin, poc: 76.6 ng/mL (ref 12–200)

## 2010-04-29 LAB — CBC
Platelets: 301 10*3/uL (ref 150–400)
RDW: 12.6 % (ref 11.5–15.5)
WBC: 9.3 10*3/uL (ref 4.0–10.5)

## 2010-04-29 LAB — POCT PREGNANCY, URINE: Preg Test, Ur: NEGATIVE

## 2010-05-01 LAB — ETHANOL: Alcohol, Ethyl (B): <5 mg/dL (ref 0–10)

## 2010-05-01 LAB — COMPREHENSIVE METABOLIC PANEL
ALT: 15 U/L (ref 0–35)
Albumin: 4 g/dL (ref 3.5–5.2)
Alkaline Phosphatase: 51 U/L (ref 39–117)
Glucose, Bld: 91 mg/dL (ref 70–99)
Potassium: 3.8 mEq/L (ref 3.5–5.1)
Sodium: 136 mEq/L (ref 135–145)
Total Protein: 6.1 g/dL (ref 6.0–8.3)

## 2010-05-01 LAB — DIFFERENTIAL
Basophils Relative: 1 % (ref 0–1)
Eosinophils Absolute: 0.2 10*3/uL (ref 0.0–0.7)
Eosinophils Relative: 3 % (ref 0–5)
Monocytes Absolute: 0.4 10*3/uL (ref 0.1–1.0)
Monocytes Relative: 6 % (ref 3–12)

## 2010-05-01 LAB — URINALYSIS, ROUTINE W REFLEX MICROSCOPIC
Nitrite: NEGATIVE
Specific Gravity, Urine: 1.003 — ABNORMAL LOW (ref 1.005–1.030)
pH: 6.5 (ref 5.0–8.0)

## 2010-05-01 LAB — RAPID URINE DRUG SCREEN, HOSP PERFORMED: Tetrahydrocannabinol: NOT DETECTED

## 2010-05-01 LAB — CBC
Hemoglobin: 13 g/dL (ref 12.0–15.0)
Platelets: 214 10*3/uL (ref 150–400)
RDW: 14.6 % (ref 11.5–15.5)

## 2010-05-01 LAB — URINE MICROSCOPIC-ADD ON

## 2010-05-17 ENCOUNTER — Emergency Department (HOSPITAL_COMMUNITY)
Admission: EM | Admit: 2010-05-17 | Discharge: 2010-05-17 | Disposition: A | Discharge status: Home / Self Care | Payer: Medicare Other | Attending: Emergency Medicine | Admitting: Emergency Medicine

## 2010-05-17 DIAGNOSIS — S139XXA Sprain of joints and ligaments of unspecified parts of neck, initial encounter: Secondary | ICD-10-CM | POA: Insufficient documentation

## 2010-05-17 DIAGNOSIS — M542 Cervicalgia: Secondary | ICD-10-CM | POA: Insufficient documentation

## 2010-05-17 DIAGNOSIS — F319 Bipolar disorder, unspecified: Secondary | ICD-10-CM | POA: Insufficient documentation

## 2010-05-17 DIAGNOSIS — Z8619 Personal history of other infectious and parasitic diseases: Secondary | ICD-10-CM | POA: Insufficient documentation

## 2010-05-17 DIAGNOSIS — I10 Essential (primary) hypertension: Secondary | ICD-10-CM | POA: Insufficient documentation

## 2010-05-17 LAB — URINALYSIS, ROUTINE W REFLEX MICROSCOPIC
Bilirubin Urine: NEGATIVE
Glucose, UA: NEGATIVE mg/dL
Hgb urine dipstick: NEGATIVE
Ketones, ur: NEGATIVE mg/dL
Nitrite: NEGATIVE
Protein, ur: NEGATIVE mg/dL
Specific Gravity, Urine: 1.012 (ref 1.005–1.030)
Urobilinogen, UA: 1 mg/dL (ref 0.0–1.0)
pH: 6.5 (ref 5.0–8.0)

## 2010-05-17 LAB — POCT I-STAT, CHEM 8
BUN: <3 mg/dL — ABNORMAL LOW (ref 6–23)
Calcium, Ion: 1.1 mmol/L — ABNORMAL LOW (ref 1.12–1.32)
Chloride: 108 mEq/L (ref 96–112)
Creatinine, Ser: 0.6 mg/dL (ref 0.4–1.2)
Glucose, Bld: 85 mg/dL (ref 70–99)
HCT: 38 % (ref 36.0–46.0)
Hemoglobin: 12.9 g/dL (ref 12.0–15.0)
Potassium: 3.2 meq/L — ABNORMAL LOW (ref 3.5–5.1)
Sodium: 147 mEq/L — ABNORMAL HIGH (ref 135–145)
TCO2: 23 mmol/L (ref 0–100)

## 2010-05-17 LAB — PREGNANCY, URINE: Preg Test, Ur: NEGATIVE

## 2010-05-18 LAB — URINE MICROSCOPIC-ADD ON

## 2010-05-18 LAB — URINALYSIS, ROUTINE W REFLEX MICROSCOPIC
Hgb urine dipstick: NEGATIVE
Leukocytes, UA: NEGATIVE
Nitrite: POSITIVE — AB
Specific Gravity, Urine: 1.013 (ref 1.005–1.030)
Urobilinogen, UA: 1 mg/dL (ref 0.0–1.0)

## 2010-05-18 LAB — DIFFERENTIAL
Basophils Absolute: 0 10*3/uL (ref 0.0–0.1)
Basophils Relative: 0 % (ref 0–1)
Lymphocytes Relative: 13 % (ref 12–46)
Neutro Abs: 5.9 10*3/uL (ref 1.7–7.7)

## 2010-05-18 LAB — BASIC METABOLIC PANEL
BUN: 4 mg/dL — ABNORMAL LOW (ref 6–23)
CO2: 30 mEq/L (ref 19–32)
Calcium: 9 mg/dL (ref 8.4–10.5)
Creatinine, Ser: 0.45 mg/dL (ref 0.4–1.2)
GFR calc non Af Amer: >60 mL/min (ref >60)
Glucose, Bld: 103 mg/dL — ABNORMAL HIGH (ref 70–99)

## 2010-05-18 LAB — PREGNANCY, URINE: Preg Test, Ur: NEGATIVE

## 2010-05-18 LAB — CBC
MCHC: 34.4 g/dL (ref 30.0–36.0)
Platelets: 204 10*3/uL (ref 150–400)
RDW: 15.3 % (ref 11.5–15.5)

## 2010-05-23 ENCOUNTER — Other Ambulatory Visit (HOSPITAL_COMMUNITY): Payer: Self-pay | Admitting: Orthopedic Surgery

## 2010-05-23 DIAGNOSIS — M545 Low back pain: Secondary | ICD-10-CM

## 2010-05-23 DIAGNOSIS — M542 Cervicalgia: Secondary | ICD-10-CM

## 2010-05-23 LAB — DIFFERENTIAL
Basophils Absolute: 0.1 10*3/uL (ref 0.0–0.1)
Basophils Relative: 1 % (ref 0–1)
Neutro Abs: 6.1 10*3/uL (ref 1.7–7.7)
Neutrophils Relative %: 64 % (ref 43–77)

## 2010-05-23 LAB — URINALYSIS, ROUTINE W REFLEX MICROSCOPIC
Ketones, ur: NEGATIVE mg/dL
Nitrite: NEGATIVE
Protein, ur: NEGATIVE mg/dL

## 2010-05-23 LAB — GC/CHLAMYDIA PROBE AMP, GENITAL: Chlamydia, DNA Probe: NEGATIVE

## 2010-05-23 LAB — WET PREP, GENITAL

## 2010-05-23 LAB — CBC
MCHC: 33.8 g/dL (ref 30.0–36.0)
Platelets: 238 10*3/uL (ref 150–400)
RDW: 13.3 % (ref 11.5–15.5)

## 2010-05-23 LAB — SAMPLE TO BLOOD BANK

## 2010-05-23 LAB — POCT PREGNANCY, URINE: Preg Test, Ur: NEGATIVE

## 2010-05-24 LAB — CBC
HCT: 35.7 % — ABNORMAL LOW (ref 36.0–46.0)
HCT: 43.7 % (ref 36.0–46.0)
Hemoglobin: 14.9 g/dL (ref 12.0–15.0)
MCV: 97.2 fL (ref 78.0–100.0)
MCV: 97.7 fL (ref 78.0–100.0)
Platelets: 212 10*3/uL (ref 150–400)
RBC: 3.65 MIL/uL — ABNORMAL LOW (ref 3.87–5.11)
RDW: 13.4 % (ref 11.5–15.5)
WBC: 8.1 10*3/uL (ref 4.0–10.5)
WBC: 8.9 10*3/uL (ref 4.0–10.5)

## 2010-05-24 LAB — DIFFERENTIAL
Basophils Absolute: 0.1 10*3/uL (ref 0.0–0.1)
Basophils Relative: 1 % (ref 0–1)
Eosinophils Absolute: 0.1 10*3/uL (ref 0.0–0.7)
Eosinophils Absolute: 0.1 10*3/uL (ref 0.0–0.7)
Eosinophils Relative: 1 % (ref 0–5)
Eosinophils Relative: 1 % (ref 0–5)
Lymphocytes Relative: 22 % (ref 12–46)
Lymphocytes Relative: 26 % (ref 12–46)
Lymphs Abs: 2.3 10*3/uL (ref 0.7–4.0)
Monocytes Absolute: 0.3 10*3/uL (ref 0.1–1.0)
Monocytes Absolute: 0.4 10*3/uL (ref 0.1–1.0)

## 2010-05-24 LAB — URINE CULTURE: Colony Count: >=100000

## 2010-05-24 LAB — COMPREHENSIVE METABOLIC PANEL
AST: 17 U/L (ref 0–37)
Alkaline Phosphatase: 58 U/L (ref 39–117)
CO2: 27 mEq/L (ref 19–32)
Chloride: 106 mEq/L (ref 96–112)
Creatinine, Ser: 0.59 mg/dL (ref 0.4–1.2)
GFR calc Af Amer: >60 mL/min (ref >60)
GFR calc non Af Amer: >60 mL/min (ref >60)
Potassium: 3.5 mEq/L (ref 3.5–5.1)
Total Bilirubin: 0.6 mg/dL (ref 0.3–1.2)

## 2010-05-24 LAB — URINALYSIS, ROUTINE W REFLEX MICROSCOPIC
Bilirubin Urine: NEGATIVE
Glucose, UA: NEGATIVE mg/dL
Hgb urine dipstick: NEGATIVE
Hgb urine dipstick: NEGATIVE
Ketones, ur: NEGATIVE mg/dL
Ketones, ur: NEGATIVE mg/dL
Nitrite: POSITIVE — AB
Protein, ur: NEGATIVE mg/dL
Protein, ur: NEGATIVE mg/dL
Urobilinogen, UA: 0.2 mg/dL (ref 0.0–1.0)
pH: 6.5 (ref 5.0–8.0)

## 2010-05-24 LAB — POCT I-STAT, CHEM 8
BUN: 3 mg/dL — ABNORMAL LOW (ref 6–23)
Calcium, Ion: 0.99 mmol/L — ABNORMAL LOW (ref 1.12–1.32)
Creatinine, Ser: 0.7 mg/dL (ref 0.4–1.2)
Hemoglobin: 15.6 g/dL — ABNORMAL HIGH (ref 12.0–15.0)
TCO2: 29 mmol/L (ref 0–100)

## 2010-05-24 LAB — LIPASE, BLOOD: Lipase: 18 U/L (ref 11–59)

## 2010-05-29 ENCOUNTER — Encounter (HOSPITAL_COMMUNITY)
Admission: RE | Admit: 2010-05-29 | Discharge: 2010-05-29 | Disposition: A | Discharge status: Home / Self Care | Payer: Medicare Other | Source: Ambulatory Visit | Attending: Orthopedic Surgery | Admitting: Orthopedic Surgery

## 2010-05-29 DIAGNOSIS — M25519 Pain in unspecified shoulder: Secondary | ICD-10-CM | POA: Insufficient documentation

## 2010-05-29 DIAGNOSIS — M546 Pain in thoracic spine: Secondary | ICD-10-CM | POA: Insufficient documentation

## 2010-05-29 DIAGNOSIS — M545 Low back pain, unspecified: Secondary | ICD-10-CM | POA: Insufficient documentation

## 2010-05-29 DIAGNOSIS — M542 Cervicalgia: Secondary | ICD-10-CM

## 2010-05-29 MED ORDER — TECHNETIUM TC 99M MEDRONATE IV KIT
25.0000 | PACK | Freq: Once | INTRAVENOUS | Status: AC | PRN
  Administered 2010-05-29: 21.3 via INTRAVENOUS

## 2010-06-21 ENCOUNTER — Other Ambulatory Visit: Payer: Self-pay | Admitting: Orthopedic Surgery

## 2010-06-21 DIAGNOSIS — D490 Neoplasm of unspecified behavior of digestive system: Secondary | ICD-10-CM

## 2010-06-21 DIAGNOSIS — D49519 Neoplasm of unspecified behavior of unspecified kidney: Secondary | ICD-10-CM
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
## 2010-06-29 NOTE — Discharge Summary (Signed)
NAME:  [name] NO.:  [id number]   MEDICAL RECORD NO.:  [id number]                   PATIENT TYPE:  IPS   LOCATION:  0508                                 FACILITY:  [facility]   PHYSICIAN:  Jeanice Lim, M.D.              DATE OF BIRTH:  16-May-1970   DATE OF ADMISSION:  01/17/2002  DATE OF DISCHARGE:  01/18/2002                                 DISCHARGE SUMMARY   IDENTIFYING DATA:  This is a 40 year old  married Caucasian  female  voluntarily admitted presenting with a history of self-inflicted superficial  wrist laceration. The patient reported cutting wrists due to being upset.  She not intending to kill herself. This did not require sutures, however,  she was quite agitated and apparently has a history of self mutilating. She  described anxiety, occasional panic attacks, decreased sleep  and appetite  and some weight loss. She denied any psychotic symptoms. She denied any  suicidal or homicidal ideation at the time of admission.   She had been followed up by Dr. Evelene Croon as an outpatient and has been  hospitalized at [facility] two years ago. She had a history of cocaine use in  the distant past and reported feeling that benzodiazepines helped her.  However, her husband felt that she clearly was more impulsive and her  judgment was impaired when she was on benzodiazepines, whether it be Xanax  or Valium.  The patient also admitted to drinking recently at a friend's  birthday party. Otherwise she rarely uses alcohol.   MEDICATIONS:  1. Paxil CR.  2. Valium 10 mg t.i.d.   ALLERGIES:  1. CODEINE.  2. PENICILLIN.   PHYSICAL EXAMINATION:  Performed at [facility] essentially within  normal limits. Neurologically nonfocal.   LABORATORY DATA:  Alcohol level 42. Urine drug screen positive for  benzodiazepines. CBC and CMET within normal limits.   PHYSICAL EXAMINATION:  Positive for a superficial left wrist laceration and  a tattoo on the  left arm only.   MENTAL STATUS EXAM:  The patient was  in bed, somewhat sleepy, speech was  clear. Mood tired, angry. Affect somewhat  irritable. She felt that she did  not belong in the hospital since she was not suicidal and that she had no  intent to kill herself, and that she was needed at home  due to having  a 4-  month-child, and her husband having  to care for the child and may lose this  child if she remains in the hospital for an  extended time. The husband  confirmed this report and also did not feel she had any acute dangerous  ideation. Cognitively she was intact. Judgment and insight were fair.   ADMISSION DIAGNOSES:   AXIS I:  Major depressive disorder, recurrent, severe without psychotic  features, rule out post traumatic stress disorder.   AXIS II:  Borderline traits.   AXIS  III:  None.   AXIS IV:  Moderate stressors with limited support system and other  psychosocial issues.   AXIS V:  35/65.   HOSPITAL COURSE:  The patient was admitted and we ordered routine  p.r.n.  medications. She underwent further monitoring. She was encouraged to  participate in individual, group and milieu therapy.  She was resumed on  Paxil which was optimized, targeting history of anxiety, panic and  depression. The patient was switched to  Ativan p.r.n. and monitored for  withdrawal symptoms. The patient participated in aftercare planning. She  felt that she had no dangerous ideation and was looking forward to being  discharged. Her husband also felt that the patient had no brisk issues and  was safe for discharge. The patient was agreeable to be discharged off of  benzodiazepines due to her history of  this possibly making her reactivity  worse.   CONDITION ON DISCHARGE:  Markedly improved. Her mood was more euthymic, the  patient was  calm, no acute withdrawal symptoms, affect brighter, thought  process is goal directed, thought content negative for any dangerous  ideation or  psychotic symptoms. The patient reported motivation to be  compliant with aftercare plan to avoid alcohol and stay off benzodiazepines  despite her anxiety.   DISCHARGE MEDICATIONS:  1. Paxil CR 37.5 mg q. a.m.  2. Seroquel 100 mg 1/2 to 1 q.h.s. p.r.n.  insomnia.   FOLLOW UP:  The patient was scheduled to see Dr. Evelene Croon on Saturday, January 23, 2002, at noon, and a therapist Monday, February 20, 2001, at 10 a.m.  which will be an important part of  her treatment.   DISCHARGE DIAGNOSES:   AXIS I:  Major depressive disorder, recurrent, severe without psychotic  features, rule out post traumatic stress disorder.   AXIS II:  Borderline traits.   AXIS III:  None.   AXIS IV:  Moderate stressors with limited support system and other  psychosocial issues.   AXIS VKallie Locks, M.D.    JEM/MEDQ  D:  01/19/2002  T:  01/19/2002  Job:  [id number]

## 2010-06-29 NOTE — Discharge Summary (Signed)
[name], [name] NO.:  [id number]   MEDICAL RECORD NO.:  [id number]          PATIENT TYPE:  INP   LOCATION:  5706                         FACILITY:  [facility]   PHYSICIAN:  Isidor Holts, M.D.  DATE OF BIRTH:  1970/06/28   DATE OF ADMISSION:  08/10/2004  DATE OF DISCHARGE:  08/19/2004                                 DISCHARGE SUMMARY   DISCHARGE DIAGNOSIS:  1.  Acute hepatitis secondary to acetaminophen toxicity against a background      of alcohol abuse.  2.  Urinary tract infection.  3.  History of bipolar disorder/prior suicide attempt.  4.  History of alcohol abuse/smoking history.   DISCHARGE MEDICATIONS:  Lasix 40 mg p.o. daily for five days, K-Dur 20 mEq  p.o. daily for five days, Nicoderm CQ patch (21 mg/24 hours) one patch to  skin daily, multi-vitamin one p.o. daily (over the counter).   PROCEDURE:  Abdominal ultrasound August 11, 2004, which was a negative  abdominal ultrasound.   CONSULTATIONS:  [name]. [name], M.D. [facility], gastroenterology   ADMISSION HISTORY:  See H&P notes of August 11, 2004, however, briefly, this is  a 40 year old female with a known history of bipolar disorder, hepatitis B  carrier, previous suicide attempt, and alcohol abuse who presents with a two  day history of right upper quadrant pain associated with vomiting.  Initial  laboratory findings in the emergency department showed markedly elevated AST  of 14,000, and ELT of 7,000.  The patient is admitted for further  evaluation, investigation, and management.   CLINICAL COURSE:  Problem 1:  Acute hepatitis.  The patient presents with a two day history of  right upper quadrant pain associated with vomiting.  She has a background  history of alcohol abuse, usually characterized by binge drinking.  Also,  she states she has quit drinking alcohol for sometime now.  It appears for  the past few days the patient has had back pain for which she has been  taking large amounts of  Tylenol, maybe 10-12 pills per day.  In any event,  serum acetaminophen levels were found to be elevated at 40.4.  She was  managed with Mucomyst, intravenous fluid hydration, and liver function tests  showed steady improvement with progressive decline in serum AST and ALT  levels.  At the time of discharge on August 19, 2004, AST was normalized at 28  while ALT was 238.  During the course of the patient's hospital stay, the  patient was without evidence of hepatic encephalopathy, right upper quadrant  pain gradually subsided, abdominal ultrasound scan done on August 11, 2004,  showed no abnormalities.   Problem 2:  Alcohol abuse/smoking history.  The patient was consulted for  alcohol abuse and at the time of discharge, was handed necessary information  with regards to follow up for management of this habit.  She was also  counseled with regards to smoking and during the course of her hospital stay  was placed on Nicoderm transdermal patch which she is expected to utilize  for the next four weeks.   Problem 3:  Urinary tract infection.  At the time of presentation, the  patient had a positive urinalysis which was characterized as asymptomatic  bacteremia; however, on August 15, 2004, the patient spiked a temperature.  She  was, therefore, commenced on a five day course of Ciprofloxacin which she  completed prior to discharge.   Problem 4:  History of bipolar disorder.  The patient was stable throughout  the course of her hospital stay.   Problem 5:  Bilateral lower extremity edema.  On August 12, 2004, the patient  was noted to have bilateral lower extremity edema.  Physical examination did  not reveal any evidence of heart failure or anasarca, bilateral lower  extremity Doppler showed no evidence of DVT.  24 hour urine collection was  carried out for proteinuria, but showed no significant proteinuria  consistent with hydronephrosis.  It was concluded that this was secondary to  fluid overload  from hydration.  IV fluids were discontinued and the patient  was placed on a course of Lasix with satisfactory clinical response.  She is  expected to continue this as well as potassium supplements for five days  following discharge.   DISPOSITION:  The patient was discharged in satisfactory condition on August 19, 2004.  Diet no restrictions.  Activity as tolerated.  Wound care not  applicable.  Pain management not applicable.  Follow up instructions:  The  patient is to establish primary MD with [facility].  She  has been supplied the phone number, i.e., [phone], and instructed to  call for an appointment.  Special instructions:  The patient has been  strongly urged to avoid Tylenol and alcohol.  She is also instructed to  elevate her feet frequently until lower extremity edema subsided.  She has  been supplied information for alcohol cessation.  All this has been  communicated to the patient and she has verbalized understanding.           ______________________________  Isidor Holts, M.D.     CO/MEDQ  D:  08/20/2004  T:  08/20/2004  Job:  [id number]   cc:   [facility]
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
## 2010-06-29 NOTE — H&P (Signed)
[name], [name]               ACCOUNT NO.:  [id number]   MEDICAL RECORD NO.:  [id number]          PATIENT TYPE:  INP   LOCATION:  1826                         FACILITY:  [facility]   PHYSICIAN:  Mobolaji B. Bakare, M.D.DATE OF BIRTH:  02/05/1971   DATE OF ADMISSION:  08/10/2004  DATE OF DISCHARGE:                                HISTORY & PHYSICAL   PRIMARY CARE PHYSICIAN:  Unassigned.   CHIEF COMPLAINT:  Abdominal pain for two days.   HISTORY OF PRESENTING COMPLAINT:  [name] is a 40 year old Caucasian  female with history of bipolar disorder, hepatitis B carrier, suicide  attempt, alcohol abuse in the past.  She started experiencing upper quadrant  pain in the last two days exacerbated by deep breathing.  It is accompanied  with fever, decreased appetite.  She vomited four times yesterday.  There is  no diarrhea.   Evaluation in the emergency department revealed markedly elevated AST and  ALT 14,000 and 7000 respectively.  The patient denies the use of  medications.  She has been sober for several months from alcohol.  She does  not do IV drugs.   REVIEW OF SYSTEMS:  She has cough productive of phlegm, no shortness of  breath, no chest pain, no headaches, denies dysuria, urgency, or increased  frequency of micturition.  She denies weight loss.   PAST MEDICAL HISTORY:  1.  Bipolar disorder.  2.  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  3.  Suicide attempt.  4.  Alcohol abuse.  5.  Past surgical history of breast implants.   MEDICATIONS:  None.   ALLERGIES:  CODEINE gives her hives.  PENICILLIN - she is not sure of what  reaction.   FAMILY HISTORY:  She is divorced and has two children.   SOCIAL HISTORY:  She smokes half a pack per day of cigarettes, and she  claims she had been sober for several months, but she did drink two beers  yesterday to help her relieve the pain.   PHYSICAL EXAMINATION:  VITAL SIGNS:  Temperature 98.5, blood pressure  112/70, pulse 96, respiratory rate 22, O2 saturation 100%.  GENERAL:  On examination, she is mildly icteric, not pale.  HEENT:  Has normocephalic and atraumatic head.  Pupils equal, round, and  reactive to light.  No carotid bruit, no elevated JVD, no thyromegaly.  Mucous membranes are moist.  LUNGS:  Clear clinically to auscultation.  CARDIOVASCULAR:  S1, S2 regular.  No murmur, no gallop.  ABDOMEN:  Right upper quadrant tenderness.  No rebound or guarding.  Bowel  sounds present.  No hepatosplenomegaly palpable.  EXTREMITIES:  No pedal edema, no calf tenderness.  Dorsalis pedis pulses 2+  bilaterally.  CENTRAL NERVOUS SYSTEM:  No focal neurological deficits.  SKIN:  No rash, no petechiae.   INITIAL LABORATORY DATA:  Pregnancy test negative.  UA - cloudy urine in  appearance, specific gravity 1.025, protein greater than 300, blood  moderate, leukocytes small, nitrites negative.  Microscopy showed 3-6 white  blood cells with many bacteria and granular casts, white count 7.7,  hemoglobin 13.5, hematocrit 38.8, platelets 106, neutrophils 89%,  lymphocytes 10%, sodium 135, potassium 3.6, chloride 102, CO2 23, glucose  104, BUN 6, creatinine 0.9, bilirubin 4.2, alkaline phosphatase 90, AST  14,954, ALT 7289, total protein 5.5, albumin 3.5, calcium 8.9, lipase 35.   ASSESSMENT AND PLAN:  [name] is a 40 year old Caucasian female with  history of hepatitis B carrier and multiple suicides in the past presenting  with abnormal transaminases, AST greater than ALT, fever, chills, nausea,  vomiting, and anorexia.  1.  Acute hepatitis, probably viral.  Will check hepatitis profile,      acetaminophen level, urine drug screen, alcohol level, PT, INR, continue      IV fluid with 20 mEq of KCl at 100 cubic centimeters per hour, Phenergan      12.5 mg IV q.4h. p.r.n. for nausea and vomiting, and Zofran p.r.n. if      Phenergan  is ineffective, Dilaudid 1 mg IV q.4h. p.r.n. for pain, obtain      ultrasound of the gallbladder.  2.  Asymptomatic bacteruria.  Will check urine culture.  3.  History of alcohol abuse.  The patient stated he is sober at this time.  4.  Tobacco abuse.  Tobacco cessation counseling.  5.  History of hepatitis B carrier.  6.  History of bipolar disorder.  7.  Thrombocytopenia probably related to alcohol abuse.  Will monitor.       MBB/MEDQ  D:  08/11/2004  T:  08/11/2004  Job:  [id number]

## 2010-06-29 NOTE — Op Note (Signed)
NAMELUCI, BELLUCCI               ACCOUNT NO.:  [id number]   MEDICAL RECORD NO.:  [id number]          PATIENT TYPE:  AMB   LOCATION:  MATC                          [facility]ACILITY:  [facility]   PHYSICIAN:  Guy Sandifer. Tomblin II, M.D.DATE OF BIRTH:  February 02, 1971   DATE OF PROCEDURE:  03/19/2004  DATE OF DISCHARGE:                                 OPERATIVE REPORT   PREOPERATIVE DIAGNOSIS:  Retained products of conception.   POSTOPERATIVE DIAGNOSIS:  Retained products of conception.   PROCEDURE:  1.  Dilation and evacuation.  2.  1% Xylocaine paracervical block.   SURGEON:  Guy Sandifer. Henderson Cloud, M.D.   ANESTHESIA:  MAC.   ANESTHESIOLOGIST:  Burnett Corrente, M.D.   ESTIMATED BLOOD LOSS:  Less than 50 mL.   SPECIMENS:  Endometrial curettings.   INDICATIONS AND CONSENT:  The patient is a 40 year old white female, G6, P2,  status post pregnancy termination in [place] on February 22, 2004, complains  of bleeding daily since that time.  [facility].  She is noted to have a temperature of 98 degrees.  Ultrasound  is consistent with products of conception.  White count 7.8, hemoglobin  12.2, blood type A positive.  Diagnosis of retained POCs is made.  The  recommendation for dilatation and evacuation is made.  The potential risks  and complications are discussed with the patient preoperatively including  but not limited to infection, bowel, bladder, or ureteral damage, bleeding  requiring transfusion of blood products with possible transfusion reaction,  HIV, hepatitis, uterine perforation, DVT, PE, pneumonia, laparotomy,  laparoscopy, secondary infertility, and hysterectomy.  All questions have  been answered and consent is signed on the chart.   PROCEDURE:  The patient was taken to the operating room where she was  identified and placed in the dorsal supine position and sedation is  administered.  She does receive Clindamycin 900 mg IV piggyback.  She was  placed  in the dorsal lithotomy position where she was prepped, bladder  straight catheterized, and she is draped in a sterile fashion.  She is noted  to have a retained tampon which is quite malodorous and is removed.  The  uterus is retroverted approximately six weeks in size.  The cervix is open  and basically requires no dilatation up to a 31 Pratt dilator.  A #7 curved  curet is placed in the uterine cavity and suction curettage was carried out  for obvious products of conception  and old blood.  Alternating sharp and  suction curettage is carried out until the cavity is clean.  10 units of  Pitocin are added to the remaining 500 mL of IV fluids after the initial  passage of the suction curet.  Good hemostasis is obtained.  It should be  noted that before the case, the anterior cervical lip is injected with 1%  Xylocaine and grasped with a single tooth tenaculum.  A paracervical block  is placed at 2, 4, 5, 7, 8, and 10 o'clock  positions with approximately 20 mL total of 1% Xylocaine plain.  The bivalve  speculum is then removed, all counts were correct.  The patient was taken to  the recovery room in stable condition.  Blood type is A positive.  She will  be discharged to home on oral antibiotics and Methergine.      JET/MEDQ  D:  03/19/2004  T:  03/19/2004  Job:  [id number]

## 2010-06-29 NOTE — Discharge Summary (Signed)
NAME:  [name], [name]                         ACCOUNT NO.:  [id number]   MEDICAL RECORD NO.:  [id number]                   PATIENT TYPE:  IPS   LOCATION:  0502                                 [facility]   PHYSICIAN:  Geoffery Lyons, M.D.                   DATE OF BIRTH:  11/11/1970   DATE OF ADMISSION:  09/27/2002  DATE OF DISCHARGE:  09/27/2002                                 DISCHARGE SUMMARY   CHIEF COMPLAINT AND PRESENT ILLNESS:  This was the second admission to [facility] for this 40 year old married white female,  voluntarily admitted.  Increased anxiety, suicidal ideation to blow her  brain out.  Reports increased panic attacks, unable to get the Klonopin from  work.  She was apparently threatening to cut her neck.  Claimed that if she  had a gun, she would have blown her head off.  Was angry, agitated, had a  history of cutting herself for relief.  She was wanting to leave by the next  day as she had a custody court hearing.  She had been feeling depressed,  sleeping six hours a night, anxious.   PAST PSYCHIATRIC HISTORY:  Second time at [facility].  Last time in  2003.  [facility] four different times.  Dr. Len Blalock.  History of bipolar.   ALCOHOL/DRUG HISTORY:  Denies the use or abuse of any substances.   PAST MEDICAL HISTORY:  Hepatitis B.   MEDICATIONS:  Lithium 300 mg in the morning and 600 mg at night, Klonopin 1  mg four times a day.   PHYSICAL EXAMINATION:  Performed and failed to show any acute findings.   MENTAL STATUS EXAM:  Alert, cooperative, anxious female with good eye  contact.  Speech is clear, goal directed.  Mood is anxious.  Affect is  anxious.  Thought processes coherent.  No evidence of psychosis.  Denies  suicidal ideation upon this evaluation.  No evidence of delusional ideas.  Cognition well-preserved.   ADMISSION DIAGNOSES:   AXIS I:  1. Bipolar disorder.  2. Anxiety disorder not otherwise  specified.   AXIS II:  No diagnosis.   AXIS III:  Hepatitis B.   AXIS IV:  Moderate.   AXIS V:  Global Assessment of Functioning upon admission 30; highest Global  Assessment of Functioning in the last year 60.   HOSPITAL COURSE:  She was admitted and provided with some structure.  She  was placed back on her medication.  She was wanting to leave.  She said that  she was not having any homicidal ideation, any suicidal ideation.  She just  stated that she needed to be back on her Klonopin.  Stated that her son was  sick in daycare and she needed to leave.  She stated that she was compliant  with medication and that she would follow up with Dr. Toni Arthurs.  We contacted  the family and Dr. Toni Arthurs and they felt that it was safe for her to be  discharged, without we went ahead and discharged to outpatient follow-up.   DISCHARGE DIAGNOSES:   AXIS I:  1. Bipolar disorder, depressed.  2. Anxiety disorder not otherwise specified.   AXIS II:  No diagnosis.   AXIS III:  Hepatitis B.   AXIS IV:  Moderate.   AXIS V:  Global Assessment of Functioning upon discharge 50.   DISCHARGE MEDICATIONS:  1. Lithium 300 mg, 1 in the morning and 2 at night.  2. Klonopin 1 mg four times a day as needed for anxiety.   FOLLOW UP:  Dr. Toni Arthurs.                                               Geoffery Lyons, M.D.    IL/MEDQ  D:  10/27/2002  T:  10/28/2002  Job:  [id number]
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
## 2010-09-30 ENCOUNTER — Emergency Department (HOSPITAL_COMMUNITY)
Admission: EM | Admit: 2010-09-30 | Discharge: 2010-10-01 | Disposition: A | Discharge status: Home / Self Care | Payer: Medicare Other | Attending: Emergency Medicine | Admitting: Emergency Medicine

## 2010-09-30 ENCOUNTER — Emergency Department (HOSPITAL_COMMUNITY): Payer: Medicare Other

## 2010-09-30 DIAGNOSIS — Z79899 Other long term (current) drug therapy: Secondary | ICD-10-CM | POA: Insufficient documentation

## 2010-09-30 DIAGNOSIS — K5900 Constipation, unspecified: Secondary | ICD-10-CM | POA: Insufficient documentation

## 2010-09-30 DIAGNOSIS — R1084 Generalized abdominal pain: Secondary | ICD-10-CM | POA: Insufficient documentation

## 2010-09-30 DIAGNOSIS — G8929 Other chronic pain: Secondary | ICD-10-CM | POA: Insufficient documentation

## 2010-09-30 DIAGNOSIS — F319 Bipolar disorder, unspecified: Secondary | ICD-10-CM | POA: Insufficient documentation

## 2010-09-30 DIAGNOSIS — R197 Diarrhea, unspecified: Secondary | ICD-10-CM | POA: Insufficient documentation

## 2010-09-30 DIAGNOSIS — Z8619 Personal history of other infectious and parasitic diseases: Secondary | ICD-10-CM | POA: Insufficient documentation

## 2010-09-30 DIAGNOSIS — K2970 Gastritis, unspecified, without bleeding: Secondary | ICD-10-CM | POA: Insufficient documentation

## 2010-09-30 DIAGNOSIS — M549 Dorsalgia, unspecified: Secondary | ICD-10-CM | POA: Insufficient documentation

## 2010-09-30 DIAGNOSIS — N390 Urinary tract infection, site not specified: Secondary | ICD-10-CM | POA: Insufficient documentation

## 2010-09-30 DIAGNOSIS — R112 Nausea with vomiting, unspecified: Secondary | ICD-10-CM | POA: Insufficient documentation

## 2010-09-30 LAB — URINALYSIS, ROUTINE W REFLEX MICROSCOPIC
Bilirubin Urine: NEGATIVE
Glucose, UA: NEGATIVE mg/dL
Hgb urine dipstick: NEGATIVE
Ketones, ur: NEGATIVE mg/dL
Protein, ur: NEGATIVE mg/dL
pH: 6.5 (ref 5.0–8.0)

## 2010-09-30 LAB — POCT I-STAT, CHEM 8
Calcium, Ion: 1.22 mmol/L (ref 1.12–1.32)
Creatinine, Ser: 0.6 mg/dL (ref 0.50–1.10)
Glucose, Bld: 77 mg/dL (ref 70–99)
HCT: 40 % (ref 36.0–46.0)
Hemoglobin: 13.6 g/dL (ref 12.0–15.0)
TCO2: 29 mmol/L (ref 0–100)

## 2010-09-30 LAB — URINE MICROSCOPIC-ADD ON

## 2010-10-01 LAB — DIFFERENTIAL
Basophils Absolute: 0 10*3/uL (ref 0.0–0.1)
Basophils Relative: 1 % (ref 0–1)
Eosinophils Absolute: 0.2 10*3/uL (ref 0.0–0.7)
Eosinophils Relative: 2 % (ref 0–5)
Lymphocytes Relative: 36 % (ref 12–46)
Monocytes Absolute: 0.8 10*3/uL (ref 0.1–1.0)

## 2010-10-01 LAB — COMPREHENSIVE METABOLIC PANEL
AST: 18 U/L (ref 0–37)
Albumin: 3.5 g/dL (ref 3.5–5.2)
Alkaline Phosphatase: 76 U/L (ref 39–117)
BUN: <3 mg/dL — ABNORMAL LOW (ref 6–23)
Potassium: 3.5 mEq/L (ref 3.5–5.1)
Total Protein: 6.5 g/dL (ref 6.0–8.3)

## 2010-10-01 LAB — CBC
HCT: 39.2 % (ref 36.0–46.0)
MCHC: 33.4 g/dL (ref 30.0–36.0)
Platelets: 277 10*3/uL (ref 150–400)
RDW: 13.3 % (ref 11.5–15.5)
WBC: 7.9 10*3/uL (ref 4.0–10.5)

## 2010-10-01 LAB — LIPASE, BLOOD: Lipase: 14 U/L (ref 11–59)

## 2010-10-01 MED ORDER — IOHEXOL 300 MG/ML  SOLN
100.0000 mL | Freq: Once | INTRAMUSCULAR | Status: AC | PRN
  Administered 2010-10-01: 100 mL via INTRAVENOUS

## 2010-11-02 LAB — URINALYSIS, ROUTINE W REFLEX MICROSCOPIC
Bilirubin Urine: NEGATIVE
Glucose, UA: NEGATIVE
Nitrite: NEGATIVE
Specific Gravity, Urine: 1.02
pH: 6

## 2010-11-02 LAB — URINE MICROSCOPIC-ADD ON

## 2010-11-13 LAB — COMPREHENSIVE METABOLIC PANEL
ALT: 15
AST: 16
Albumin: 3.9
Alkaline Phosphatase: 55
BUN: 4 — ABNORMAL LOW
CO2: 28
Calcium: 8.3 — ABNORMAL LOW
Chloride: 105
Chloride: 109
Creatinine, Ser: 0.59
GFR calc Af Amer: >60
GFR calc Af Amer: >60
GFR calc non Af Amer: >60
Glucose, Bld: 102 — ABNORMAL HIGH
Glucose, Bld: 116 — ABNORMAL HIGH
Potassium: 3.6
Sodium: 139
Total Bilirubin: 0.3
Total Bilirubin: 0.4
Total Protein: 5.1 — ABNORMAL LOW
Total Protein: 6.8

## 2010-11-13 LAB — URINALYSIS, ROUTINE W REFLEX MICROSCOPIC
Bilirubin Urine: NEGATIVE
Glucose, UA: NEGATIVE
Ketones, ur: NEGATIVE
Ketones, ur: NEGATIVE
Nitrite: POSITIVE — AB
Protein, ur: NEGATIVE
Protein, ur: NEGATIVE
Specific Gravity, Urine: 1.01
Urobilinogen, UA: 0.2
Urobilinogen, UA: 0.2
Urobilinogen, UA: 1

## 2010-11-13 LAB — DIFFERENTIAL
Basophils Absolute: 0
Basophils Absolute: 0
Basophils Relative: 0
Basophils Relative: 1
Eosinophils Absolute: 0.4
Lymphocytes Relative: 25
Lymphs Abs: 2.7
Monocytes Relative: 4
Monocytes Relative: 6
Neutro Abs: 4.4
Neutro Abs: 6.2
Neutrophils Relative %: 55
Neutrophils Relative %: 61
Neutrophils Relative %: 70

## 2010-11-13 LAB — URINE CULTURE: Colony Count: >=100000

## 2010-11-13 LAB — POCT PREGNANCY, URINE: Preg Test, Ur: NEGATIVE

## 2010-11-13 LAB — CBC
HCT: 34.6 — ABNORMAL LOW
Hemoglobin: 11.6 — ABNORMAL LOW
Hemoglobin: 12.2
MCHC: 34
MCV: 97.6
Platelets: 217
Platelets: 300
RDW: 14.2
RDW: 14.7
RDW: 15.2
WBC: 7.3
WBC: 8.9

## 2010-11-13 LAB — PREGNANCY, URINE: Preg Test, Ur: NEGATIVE

## 2010-11-19 LAB — URINALYSIS, ROUTINE W REFLEX MICROSCOPIC
Bilirubin Urine: NEGATIVE
Ketones, ur: NEGATIVE
Nitrite: NEGATIVE
Protein, ur: NEGATIVE
Urobilinogen, UA: 0.2

## 2010-11-19 LAB — DIFFERENTIAL
Eosinophils Absolute: 0.3
Lymphs Abs: 3
Monocytes Relative: 5
Neutrophils Relative %: 50

## 2010-11-19 LAB — CBC
MCHC: 34.4
Platelets: 299
RDW: 16.5 — ABNORMAL HIGH

## 2010-11-19 LAB — COMPREHENSIVE METABOLIC PANEL
ALT: 9
AST: 22
Albumin: 3.8
Calcium: 9
GFR calc Af Amer: >60
Glucose, Bld: 94
Sodium: 138
Total Protein: 6.5

## 2010-11-20 LAB — COMPREHENSIVE METABOLIC PANEL
Albumin: 3.3 — ABNORMAL LOW
BUN: 6
CO2: 33 — ABNORMAL HIGH
Chloride: 98
Creatinine, Ser: 0.55
GFR calc non Af Amer: >60
Total Bilirubin: 0.7

## 2010-11-20 LAB — URINALYSIS, ROUTINE W REFLEX MICROSCOPIC
Nitrite: NEGATIVE
Specific Gravity, Urine: 1.028
pH: 7.5

## 2010-11-20 LAB — CBC
HCT: 37.5
MCV: 95.8
Platelets: 264
WBC: 7.4

## 2010-11-20 LAB — PREGNANCY, URINE: Preg Test, Ur: NEGATIVE

## 2010-11-20 LAB — URINE CULTURE: Colony Count: >=100000

## 2010-11-20 LAB — DIFFERENTIAL
Basophils Absolute: 0
Lymphocytes Relative: 30
Neutro Abs: 4.6
Neutrophils Relative %: 62

## 2010-11-20 LAB — RAPID URINE DRUG SCREEN, HOSP PERFORMED
Amphetamines: NOT DETECTED
Cocaine: NOT DETECTED
Tetrahydrocannabinol: NOT DETECTED

## 2010-11-20 LAB — LIPASE, BLOOD: Lipase: 14

## 2010-11-20 LAB — URINE MICROSCOPIC-ADD ON

## 2010-11-20 LAB — ACETAMINOPHEN LEVEL: Acetaminophen (Tylenol), Serum: 11.5

## 2011-04-16 ENCOUNTER — Emergency Department (HOSPITAL_COMMUNITY)
Admission: EM | Admit: 2011-04-16 | Discharge: 2011-04-16 | Disposition: A | Discharge status: Home / Self Care | Payer: Medicare Other | Attending: Emergency Medicine | Admitting: Emergency Medicine

## 2011-04-16 ENCOUNTER — Emergency Department (HOSPITAL_COMMUNITY): Payer: Medicare Other

## 2011-04-16 DIAGNOSIS — E876 Hypokalemia: Secondary | ICD-10-CM | POA: Insufficient documentation

## 2011-04-16 DIAGNOSIS — R112 Nausea with vomiting, unspecified: Secondary | ICD-10-CM | POA: Insufficient documentation

## 2011-04-16 DIAGNOSIS — W1789XA Other fall from one level to another, initial encounter: Secondary | ICD-10-CM | POA: Insufficient documentation

## 2011-04-16 DIAGNOSIS — W19XXXA Unspecified fall, initial encounter: Secondary | ICD-10-CM

## 2011-04-16 DIAGNOSIS — R51 Headache: Secondary | ICD-10-CM | POA: Insufficient documentation

## 2011-04-16 DIAGNOSIS — M545 Low back pain, unspecified: Secondary | ICD-10-CM | POA: Insufficient documentation

## 2011-04-16 DIAGNOSIS — M542 Cervicalgia: Secondary | ICD-10-CM | POA: Insufficient documentation

## 2011-04-16 LAB — POCT I-STAT, CHEM 8
Calcium, Ion: 1.18 mmol/L (ref 1.12–1.32)
Glucose, Bld: 89 mg/dL (ref 70–99)
HCT: 33 % — ABNORMAL LOW (ref 36.0–46.0)
Hemoglobin: 11.2 g/dL — ABNORMAL LOW (ref 12.0–15.0)
Potassium: 3.1 mEq/L — ABNORMAL LOW (ref 3.5–5.1)
TCO2: 28 mmol/L (ref 0–100)

## 2011-04-16 LAB — URINE MICROSCOPIC-ADD ON

## 2011-04-16 LAB — URINALYSIS, ROUTINE W REFLEX MICROSCOPIC
Leukocytes, UA: NEGATIVE
Specific Gravity, Urine: 1.005 (ref 1.005–1.030)
Urobilinogen, UA: 1 mg/dL (ref 0.0–1.0)

## 2011-04-16 MED ORDER — POTASSIUM CHLORIDE ER 10 MEQ PO TBCR: 10.0000 meq | EXTENDED_RELEASE_TABLET | Freq: Two times a day (BID) | ORAL | Status: DC

## 2011-04-16 MED ORDER — ONDANSETRON HCL 4 MG/2ML IJ SOLN
4.0000 mg | Freq: Once | INTRAMUSCULAR | Status: AC
  Administered 2011-04-16: 4 mg via INTRAVENOUS
  Filled 2011-04-16: qty 2

## 2011-04-16 MED ORDER — POTASSIUM CHLORIDE CRYS ER 20 MEQ PO TBCR
40.0000 meq | EXTENDED_RELEASE_TABLET | Freq: Once | ORAL | Status: AC
  Administered 2011-04-16: 40 meq via ORAL
  Filled 2011-04-16: qty 2

## 2011-04-16 MED ORDER — SODIUM CHLORIDE 0.9 % IV BOLUS (SEPSIS)
500.0000 mL | Freq: Once | INTRAVENOUS | Status: AC
  Administered 2011-04-16: 500 mL via INTRAVENOUS

## 2011-04-16 MED ORDER — HYDROMORPHONE HCL PF 1 MG/ML IJ SOLN
0.5000 mg | Freq: Once | INTRAMUSCULAR | Status: AC
  Administered 2011-04-16: 0.5 mg via INTRAVENOUS
  Filled 2011-04-16: qty 1

## 2011-04-16 MED ORDER — OXYCODONE-ACETAMINOPHEN 5-325 MG PO TABS
2.0000 | ORAL_TABLET | Freq: Once | ORAL | Status: AC
  Administered 2011-04-16: 2 via ORAL
  Filled 2011-04-16: qty 2

## 2011-04-16 MED ORDER — HYDROCODONE-ACETAMINOPHEN 5-325 MG PO TABS: 1.0000 | ORAL_TABLET | Freq: Four times a day (QID) | ORAL | Status: AC | PRN

## 2011-04-16 NOTE — ED Notes (Signed)
Pt to CT.  Pt requesting Dilaudid.  NP notified.  Pt drinking MtDew,denies nausea.

## 2011-04-16 NOTE — ED Notes (Signed)
Pt to CT

## 2011-04-16 NOTE — Discharge Instructions (Signed)
Headache, General, Unknown Cause The specific cause of your headache may not have been found today. There are many causes and types of headache. A few common ones are:  Tension headache.   Migraine.   Infections (examples: dental and sinus infections).   Bone and/or joint problems in the neck or jaw.   Depression.   Eye problems.  These headaches are not life threatening.  Headaches can sometimes be diagnosed by a patient history and a physical exam. Sometimes, lab and imaging studies (such as x-ray and/or CT scan) are used to rule out more serious problems. In some cases, a spinal tap (lumbar puncture) may be requested. There are many times when your exam and tests may be normal on the first visit even when there is a serious problem causing your headaches. Because of that, it is very important to follow up with your doctor or local clinic for further evaluation. FINDING OUT THE RESULTS OF TESTS  If a radiology test was performed, a radiologist will review your results.   You will be contacted by the emergency department or your physician if any test results require a change in your treatment plan.   Not all test results may be available during your visit. If your test results are not back during the visit, make an appointment with your caregiver to find out the results. Do not assume everything is normal if you have not heard from your caregiver or the medical facility. It is important for you to follow up on all of your test results.  HOME CARE INSTRUCTIONS   Keep follow-up appointments with your caregiver, or any specialist referral.   Only take over-the-counter or prescription medicines for pain, discomfort, or fever as directed by your caregiver.   Biofeedback, massage, or other relaxation techniques may be helpful.   Ice packs or heat applied to the head and neck can be used. Do this three to four times per day, or as needed.   Call your doctor if you have any questions or  concerns.   If you smoke, you should quit.  SEEK MEDICAL CARE IF:   You develop problems with medications prescribed.   You do not respond to or obtain relief from medications.   You have a change from the usual headache.   You develop nausea or vomiting.  SEEK IMMEDIATE MEDICAL CARE IF:   If your headache becomes severe.   You have an unexplained oral temperature above 102 F (38.9 C), or as your caregiver suggests.   You have a stiff neck.   You have loss of vision.   You have muscular weakness.   You have loss of muscular control.   You develop severe symptoms different from your first symptoms.   You start losing your balance or have trouble walking.   You feel faint or pass out.  MAKE SURE YOU:   Understand these instructions.   Will watch your condition.   Will get help right away if you are not doing well or get worse.  Document Released: 01/28/2005 Document Revised: 01/17/2011 Document Reviewed: 09/17/2007 [facility] Patient Information 2012 [place], [place].  Back Pain, Adult Low back pain is very common. About 1 in 5 people have back pain.The cause of low back pain is rarely dangerous. The pain often gets better over time.About half of people with a sudden onset of back pain feel better in just 2 weeks. About 8 in 10 people feel better by 6 weeks.  CAUSES Some common causes of  back pain include:  Strain of the muscles or ligaments supporting the spine.   Wear and tear (degeneration) of the spinal discs.   Arthritis.   Direct injury to the back.  DIAGNOSIS Most of the time, the direct cause of low back pain is not known.However, back pain can be treated effectively even when the exact cause of the pain is unknown.Answering your caregiver's questions about your overall health and symptoms is one of the most accurate ways to make sure the cause of your pain is not dangerous. If your caregiver needs more information, he or she may order lab work or  imaging tests (X-rays or MRIs).However, even if imaging tests show changes in your back, this usually does not require surgery. HOME CARE INSTRUCTIONS For many people, back pain returns.Since low back pain is rarely dangerous, it is often a condition that people can learn to [facility] their own.   Remain active. It is stressful on the back to sit or stand in one place. Do not sit, drive, or stand in one place for more than 30 minutes at a time. Take short walks on level surfaces as soon as pain allows.Try to increase the length of time you walk each day.   Do not stay in bed.Resting more than 1 or 2 days can delay your recovery.   Do not avoid exercise or work.Your body is made to move.It is not dangerous to be active, even though your back may hurt.Your back will likely heal faster if you return to being active before your pain is gone.   Pay attention to your body when you bend and lift. Many people have less discomfortwhen lifting if they bend their knees, keep the load close to their bodies,and avoid twisting. Often, the most comfortable positions are those that put less stress on your recovering back.   Find a comfortable position to sleep. Use a firm mattress and lie on your side with your knees slightly bent. If you lie on your back, put a pillow under your knees.   Only take over-the-counter or prescription medicines as directed by your caregiver. Over-the-counter medicines to reduce pain and inflammation are often the most helpful.Your caregiver may prescribe muscle relaxant drugs.These medicines help dull your pain so you can more quickly return to your normal activities and healthy exercise.   Put ice on the injured area.   Put ice in a plastic bag.   Place a towel between your skin and the bag.   Leave the ice on for 15 to 20 minutes, 3 to 4 times a day for the first 2 to 3 days. After that, ice and heat may be alternated to reduce pain and spasms.   Ask your caregiver  about trying back exercises and gentle massage. This may be of some benefit.   Avoid feeling anxious or stressed.Stress increases muscle tension and can worsen back pain.It is important to recognize when you are anxious or stressed and learn ways to manage it.Exercise is a great option.  SEEK MEDICAL CARE IF:  You have pain that is not relieved with rest or medicine.   You have pain that does not improve in 1 week.   You have new symptoms.   You are generally not feeling well.  SEEK IMMEDIATE MEDICAL CARE IF:   You have pain that radiates from your back into your legs.   You develop new bowel or bladder control problems.   You have unusual weakness or numbness in your arms or  legs.   You develop nausea or vomiting.   You develop abdominal pain.   You feel faint.  Document Released: 01/28/2005 Document Revised: 01/17/2011 Document Reviewed: 06/18/2010 [facility] Patient Information 2012 [place], [place].

## 2011-04-16 NOTE — ED Notes (Signed)
Report given to Pat, RN.

## 2011-04-16 NOTE — ED Notes (Signed)
Pt sts she has "sever pain in her back". Pt has had chronic pain and over the past four days the pain has increased after she fell several days ago.

## 2011-04-16 NOTE — ED Notes (Signed)
Pt again requesting pain meds.  Will notify PA.

## 2011-04-16 NOTE — ED Provider Notes (Signed)
History     CSN: [id number]  Arrival date & time 04/16/11  1840   First MD Initiated Contact with Patient 04/16/11 2115      Chief Complaint  Patient presents with  . Back Pain    (Consider location/radiation/quality/duration/timing/severity/associated sxs/prior treatment) Patient is a 41 y.o. female presenting with fall, back pain, and headaches.  Fall The accident occurred more than 2 days ago. The fall occurred while walking. She fell from a height of 1 to 2 ft. She landed on a hard floor. There was no blood loss. The pain is present in the head. The pain is moderate. She was ambulatory at the scene. There was no entrapment after the fall. Associated symptoms include nausea, vomiting and headaches. Pertinent negatives include no loss of consciousness. She has tried nothing for the symptoms.  Back Pain  The current episode started more than 2 days ago. The problem occurs constantly. The problem has been gradually worsening. The pain is associated with falling. The pain is present in the lumbar spine. The quality of the pain is described as aching. The pain does not radiate. The pain is severe. The symptoms are aggravated by bending. The pain is the same all the time. Associated symptoms include headaches. Pertinent negatives include no bladder incontinence and no paresthesias. She has tried nothing for the symptoms.  Headache  The current episode started more than 2 days ago. The problem has been gradually worsening. The headache is associated with an unknown factor. The quality of the pain is described as throbbing. The pain is moderate. The pain does not radiate. Associated symptoms include nausea and vomiting. She has tried nothing for the symptoms.   Chronic back pain d/t DDD per patient. No past medical history on file.  No past surgical history on file.  No family history on file.  History  Substance Use Topics  . Smoking status: Not on file  . Smokeless tobacco: Not on file    . Alcohol Use: Not on file    OB History    No data available      Review of Systems  HENT: Positive for neck pain.   Gastrointestinal: Positive for nausea and vomiting.  Genitourinary: Negative for bladder incontinence.  Musculoskeletal: Positive for back pain.  Neurological: Positive for headaches. Negative for loss of consciousness and paresthesias.  All other systems reviewed and are negative.    Allergies  Codeine and Penicillins  Home Medications   Current Outpatient Rx  Name Route Sig Dispense Refill  . ALPRAZOLAM 1 MG PO TABS Oral Take 1 mg by mouth at bedtime as needed. For anxiety    . CITALOPRAM HYDROBROMIDE 20 MG PO TABS Oral Take 20 mg by mouth daily.    Marland Kitchen GABAPENTIN 600 MG PO TABS Oral Take 600 mg by mouth 3 (three) times daily.      BP 111/75  Pulse 100  Temp(Src) 98.6 F (37 C) (Oral)  Resp 20  SpO2 100%  Physical Exam  Nursing note and vitals reviewed. Constitutional: She is oriented to person, place, and time. She appears well-developed.  HENT:  Head: Normocephalic and atraumatic.  Eyes: Pupils are equal, round, and reactive to light.  Neck: Neck supple.    Cardiovascular: Normal rate, regular rhythm, normal heart sounds and intact distal pulses.   Pulmonary/Chest: Effort normal and breath sounds normal.  Abdominal: Soft. Bowel sounds are normal.  Musculoskeletal: Normal range of motion.       Arms: Neurological: She is alert and  oriented to person, place, and time.  Skin: Skin is warm and dry.  Psychiatric: She has a normal mood and affect. Her behavior is normal. Judgment and thought content normal.    ED Course  Procedures (including critical care time)  Labs Reviewed - No data to display No results found.   No diagnosis found.  Labs and radiology results reviewed and discussed with patient and with Dr. Patria Mane.  No indication of new bony abnormality d/t fall.  Low K treated with po potassium.  MDM          Jimmye Norman, NP 04/16/11 [phone]

## 2011-04-17 ENCOUNTER — Encounter (HOSPITAL_COMMUNITY): Payer: Self-pay | Admitting: Emergency Medicine

## 2011-04-17 NOTE — ED Provider Notes (Signed)
Medical screening examination/treatment/procedure(s) were performed by non-physician practitioner and as supervising physician I was immediately available for consultation/collaboration.    Lyanne Co, MD 04/17/11 [phone]

## 2011-06-16 ENCOUNTER — Emergency Department: Payer: Self-pay | Admitting: Emergency Medicine

## 2011-09-04 ENCOUNTER — Encounter (HOSPITAL_COMMUNITY): Payer: Self-pay | Admitting: *Deleted

## 2011-09-04 ENCOUNTER — Emergency Department (HOSPITAL_COMMUNITY)
Admission: EM | Admit: 2011-09-04 | Discharge: 2011-09-04 | Disposition: A | Discharge status: Home / Self Care | Payer: Medicare Other | Attending: Emergency Medicine | Admitting: Emergency Medicine

## 2011-09-04 DIAGNOSIS — F17200 Nicotine dependence, unspecified, uncomplicated: Secondary | ICD-10-CM | POA: Insufficient documentation

## 2011-09-04 DIAGNOSIS — M549 Dorsalgia, unspecified: Secondary | ICD-10-CM

## 2011-09-04 DIAGNOSIS — B1920 Unspecified viral hepatitis C without hepatic coma: Secondary | ICD-10-CM | POA: Insufficient documentation

## 2011-09-04 DIAGNOSIS — IMO0002 Reserved for concepts with insufficient information to code with codable children: Secondary | ICD-10-CM | POA: Insufficient documentation

## 2011-09-04 HISTORY — DX: Reserved for concepts with insufficient information to code with codable children: IMO0002

## 2011-09-04 HISTORY — DX: Acute hepatitis B without delta-agent and without hepatic coma: B16.9

## 2011-09-04 HISTORY — DX: Acute hepatitis C without hepatic coma: B17.10

## 2011-09-04 MED ORDER — OXYCODONE HCL 5 MG PO TABS: 5.0000 mg | ORAL_TABLET | Freq: Four times a day (QID) | ORAL | Status: AC | PRN

## 2011-09-04 MED ORDER — NAPROXEN 375 MG PO TABS: 375.0000 mg | ORAL_TABLET | Freq: Two times a day (BID) | ORAL | Status: DC

## 2011-09-04 MED ORDER — PREDNISONE 20 MG PO TABS: 60.0000 mg | ORAL_TABLET | Freq: Once | ORAL | Status: DC

## 2011-09-04 MED ORDER — PREDNISONE 10 MG PO TABS: 40.0000 mg | ORAL_TABLET | Freq: Every day | ORAL | Status: DC

## 2011-09-04 MED ORDER — HYDROMORPHONE HCL PF 1 MG/ML IJ SOLN: 1.0000 mg | Freq: Once | INTRAMUSCULAR | Status: DC
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
## 2011-09-04 NOTE — ED Provider Notes (Signed)
Medical screening examination/treatment/procedure(s) were performed by non-physician practitioner and as supervising physician I was immediately available for consultation/collaboration.  Quynh Basso K Linker, MD 09/04/11 1751 

## 2011-09-04 NOTE — Progress Notes (Signed)
[place] listed as county of residence but pt states she has recently moved to [place] in last 2 months and plans to remain in [place] No pcp, self pay pt [facility] ED CM spoke with pt who confirms self pay [place] resident with no have a pcp county. Discussed the importance of a pcp for f/u. Reviewed Health connect number to assist with finding self pay provider close to pt's residence. Reviewed resources for [facility], general medial clinics, medications-needymeds.com, housing, DSS, health Department and other resources in [place] including crisis programs Pt voiced understanding and appreciation of resources provided

## 2011-09-04 NOTE — ED Provider Notes (Signed)
History     CSN: [id number]  Arrival date & time 09/04/11  1502   First MD Initiated Contact with Patient 09/04/11 1604      Chief Complaint  Patient presents with  . Back Pain    (Consider location/radiation/quality/duration/timing/severity/associated sxs/prior treatment) Patient is a 41 y.o. female presenting with back pain. The history is provided by the patient.  Back Pain  This is a chronic problem. Episode onset: current exacerbation for last 4-5 days with no new injury. The problem occurs constantly. The problem has not changed since onset.The pain is present in the lumbar spine and thoracic spine. The quality of the pain is described as stabbing. The pain radiates to the left thigh. The pain is severe. The symptoms are aggravated by bending, twisting and certain positions. The pain is the same all the time. Associated symptoms include bowel incontinence (for the last year, not daily), leg pain and tingling. Pertinent negatives include no chest pain, no fever, no numbness, no weight loss, no abdominal pain, no perianal numbness, no bladder incontinence, no dysuria, no pelvic pain, no paresis and no weakness. She has tried NSAIDs for the symptoms. The treatment provided no relief.    Past Medical History  Diagnosis Date  . DDD (degenerative disc disease)   . Acute hepatitis b   . Hepatitis C, acute     History reviewed. No pertinent past surgical history.  No family history on file.  History  Substance Use Topics  . Smoking status: Current Everyday Smoker  . Smokeless tobacco: Not on file  . Alcohol Use: No     Review of Systems  Constitutional: Negative for fever and weight loss.  Cardiovascular: Negative for chest pain.  Gastrointestinal: Positive for bowel incontinence (for the last year, not daily). Negative for abdominal pain.  Genitourinary: Negative for bladder incontinence, dysuria and pelvic pain.  Musculoskeletal: Positive for back pain.  Neurological:  Positive for tingling. Negative for weakness and numbness.  All other systems reviewed and are negative.    Allergies  Acetaminophen; Codeine; and Penicillins  Home Medications   Current Outpatient Rx  Name Route Sig Dispense Refill  . ALPRAZOLAM 1 MG PO TABS Oral Take 1 mg by mouth at bedtime as needed. For anxiety    . CITALOPRAM HYDROBROMIDE 20 MG PO TABS Oral Take 20 mg by mouth daily.    Marland Kitchen GABAPENTIN 600 MG PO TABS Oral Take 600 mg by mouth 3 (three) times daily.      BP 107/74  Pulse 88  Temp 98.3 F (36.8 C) (Oral)  SpO2 100%  Physical Exam  Nursing note reviewed. Constitutional: She is oriented to person, place, and time. She appears well-developed and well-nourished.       Vital signs are reviewed and are normal.Uncomfortable appearing.   HENT:  Head: Normocephalic and atraumatic.       Oral mucosa moist  Eyes: Conjunctivae are normal.  Neck: Neck supple.  Cardiovascular: Normal rate and regular rhythm.        Bilateral radial and DP pulses are 2+  Pulmonary/Chest: Effort normal. No respiratory distress.  Abdominal: Soft. Bowel sounds are normal. She exhibits no distension. There is no tenderness.  Genitourinary: Rectal exam shows anal tone normal.  Musculoskeletal:       Cervical back: She exhibits no deformity.       Thoracic back: She exhibits no deformity.       Lumbar back: She exhibits no deformity.       Back:  Neurological: She is alert and oriented to person, place, and time. Gait (antalgic without ataxia) abnormal. GCS eye subscore is 4. GCS verbal subscore is 5. GCS motor subscore is 6.       Right patellar reflex 2+, left patellar reflex trace. Strength preserved to all major muscle groups of BLE. Sensation altered to scattered areas of LLE but present to light touch to BLE.  Skin: Skin is warm and dry. No rash noted.    ED Course  Procedures (including critical care time)  Labs Reviewed - No data to display No results found.   1. Back pain        MDM  Chronic back pain. No new injury. Fecal incontinence is concerning. However, this appears to be chronic and does not occur daily or with every bowel movement and rectal tone is normal on exam as is sensation to touch in the parineal region. Cannot determine chronicity of decreased reflex to the left side. Given these findings combined with preserved strength, emergent MRI is not warranted. Patient will followup with her orthopedist for further testing. Return precautions discussed. No recent narcotic prescriptions in controlled substance database. will give rx for redness, pain medication, nsaids.        Shaaron Adler, [place] 09/04/11 1659

## 2011-10-11 ENCOUNTER — Encounter (HOSPITAL_COMMUNITY): Payer: Self-pay | Admitting: Family

## 2011-10-11 ENCOUNTER — Inpatient Hospital Stay (HOSPITAL_COMMUNITY)
Admission: AD | Admit: 2011-10-11 | Discharge: 2011-10-11 | Disposition: A | Discharge status: Home / Self Care | Payer: Medicare Other | Source: Ambulatory Visit | Attending: Obstetrics and Gynecology | Admitting: Obstetrics and Gynecology

## 2011-10-11 ENCOUNTER — Inpatient Hospital Stay (HOSPITAL_COMMUNITY): Payer: Medicare Other

## 2011-10-11 DIAGNOSIS — O2340 Unspecified infection of urinary tract in pregnancy, unspecified trimester: Secondary | ICD-10-CM

## 2011-10-11 DIAGNOSIS — O209 Hemorrhage in early pregnancy, unspecified: Secondary | ICD-10-CM | POA: Insufficient documentation

## 2011-10-11 DIAGNOSIS — O2390 Unspecified genitourinary tract infection in pregnancy, unspecified trimester: Secondary | ICD-10-CM | POA: Insufficient documentation

## 2011-10-11 DIAGNOSIS — N390 Urinary tract infection, site not specified: Secondary | ICD-10-CM | POA: Insufficient documentation

## 2011-10-11 HISTORY — DX: Chronic obstructive pulmonary disease, unspecified: J44.9

## 2011-10-11 HISTORY — DX: Anxiety disorder, unspecified: F41.9

## 2011-10-11 LAB — URINALYSIS, ROUTINE W REFLEX MICROSCOPIC
Bilirubin Urine: NEGATIVE
Ketones, ur: NEGATIVE mg/dL
Leukocytes, UA: NEGATIVE
Nitrite: POSITIVE — AB
Protein, ur: NEGATIVE mg/dL

## 2011-10-11 LAB — URINE MICROSCOPIC-ADD ON

## 2011-10-11 LAB — CBC
HCT: 38.1 % (ref 36.0–46.0)
MCH: 30.7 pg (ref 26.0–34.0)
MCV: 93.6 fL (ref 78.0–100.0)
Platelets: 212 10*3/uL (ref 150–400)
RDW: 13.6 % (ref 11.5–15.5)

## 2011-10-11 LAB — WET PREP, GENITAL
Clue Cells Wet Prep HPF POC: NONE SEEN
Trich, Wet Prep: NONE SEEN
Yeast Wet Prep HPF POC: NONE SEEN

## 2011-10-11 MED ORDER — NITROFURANTOIN MONOHYD MACRO 100 MG PO CAPS: 100.0000 mg | ORAL_CAPSULE | Freq: Two times a day (BID) | ORAL | Status: AC

## 2011-10-11 MED ORDER — HYDROMORPHONE HCL PF 1 MG/ML IJ SOLN
1.0000 mg | Freq: Once | INTRAMUSCULAR | Status: AC
  Administered 2011-10-11: 1 mg via INTRAMUSCULAR
  Filled 2011-10-11: qty 1

## 2011-10-11 MED ORDER — KETOROLAC TROMETHAMINE 60 MG/2ML IM SOLN
60.0000 mg | Freq: Once | INTRAMUSCULAR | Status: AC
  Administered 2011-10-11: 60 mg via INTRAMUSCULAR
  Filled 2011-10-11: qty 2

## 2011-10-11 MED ORDER — OXYCODONE HCL 5 MG PO TABS: 5.0000 mg | ORAL_TABLET | Freq: Four times a day (QID) | ORAL | Status: AC | PRN

## 2011-10-11 NOTE — MAU Note (Signed)
Pt says she had intercourse on Sunday; had been spotting last Tuesday. Bleeding is worse now and "having bad cramps and really bad headache."

## 2011-10-11 NOTE — MAU Note (Signed)
Patient states she had a positive home pregnancy test about one month ago. Has been spotting but today heavy. Patient has a tampon in at this time and unable to assess the bleeding. Has started having abdominal cramping and a headache.

## 2011-10-11 NOTE — MAU Provider Note (Signed)
History     CSN: [id number]  Arrival date and time: 10/11/11 1612   None     Chief Complaint  Patient presents with  . Possible Pregnancy  . Vaginal Bleeding  . Abdominal Pain   HPI  Pt says she had intercourse on Sunday; had been spotting last Tuesday. Bleeding is worse now and "having bad cramps and really bad headache."  First positive pregnancy test 4 weeks ago.     Past Medical History  Diagnosis Date  . DDD (degenerative disc disease)   . Acute hepatitis b   . Hepatitis C, acute   . COPD (chronic obstructive pulmonary disease)     July 2006   . Anxiety     treated with xanax    Past Surgical History  Procedure Date  . Breast surgery     19 96 breast augmentation    History reviewed. No pertinent family history.  History  Substance Use Topics  . Smoking status: Current Everyday Smoker -- 0.5 packs/day for 26 years  . Smokeless tobacco: Never Used  . Alcohol Use: No    Allergies:  Allergies  Allergen Reactions  . Codeine Shortness Of Breath and Swelling    Throat swelling  . Penicillins Shortness Of Breath and Swelling    Throat swelling  . Acetaminophen Other (See Comments)    Liver problem  . Crab (Shellfish Allergy) Swelling    Prescriptions prior to admission  Medication Sig Dispense Refill  . ALPRAZolam (XANAX) 1 MG tablet Take 1 mg by mouth 4 (four) times daily as needed. For anxiety or sleep      . gabapentin (NEURONTIN) 600 MG tablet Take 600 mg by mouth 3 (three) times daily.        ROS Physical Exam   Blood pressure 128/88, pulse 99, temperature 98.7 F (37.1 C), temperature source Oral, resp. rate 18, height 5\' 4"  (1.626 m), weight 59.058 kg (130 lb 3.2 oz), last menstrual period 07/16/2011, SpO2 100.00%.  Physical Exam  Constitutional: She is oriented to person, place, and time. She appears well-developed and well-nourished.       Uncomfortable appearing  HENT:  Head: Normocephalic.  Neck: Normal range of motion. Neck supple.    Cardiovascular: Normal rate, regular rhythm and normal heart sounds.  Exam reveals no gallop and no friction rub.   No murmur heard. Respiratory: Effort normal and breath sounds normal. No respiratory distress.  GI: Soft. She exhibits no mass. There is tenderness (suprapubic). There is no rebound and no guarding.  Genitourinary: There is bleeding (moderate; cervix open) around the vagina.  Neurological: She is alert and oriented to person, place, and time.  Skin: Skin is warm and dry.    MAU Course  Procedures  Results for orders placed during the hospital encounter of 10/11/11 (from the past 24 hour(s))  URINALYSIS, ROUTINE W REFLEX MICROSCOPIC     Status: Abnormal   Collection Time   10/11/11  4:35 PM      Component Value Range   Color, Urine YELLOW  YELLOW   APPearance HAZY (*) CLEAR   Specific Gravity, Urine >1.030 (*) 1.005 - 1.030   pH 6.0  5.0 - 8.0   Glucose, UA NEGATIVE  NEGATIVE mg/dL   Hgb urine dipstick LARGE (*) NEGATIVE   Bilirubin Urine NEGATIVE  NEGATIVE   Ketones, ur NEGATIVE  NEGATIVE mg/dL   Protein, ur NEGATIVE  NEGATIVE mg/dL   Urobilinogen, UA 0.2  0.0 - 1.0 mg/dL   Nitrite POSITIVE (*)  NEGATIVE   Leukocytes, UA NEGATIVE  NEGATIVE  URINE MICROSCOPIC-ADD ON     Status: Abnormal   Collection Time   10/11/11  4:35 PM      Component Value Range   Squamous Epithelial / LPF FEW (*) RARE   WBC, UA 0-2  <3 WBC/hpf   RBC / HPF 0-2  <3 RBC/hpf   Bacteria, UA MANY (*) RARE  POCT PREGNANCY, URINE     Status: Abnormal   Collection Time   10/11/11  4:43 PM      Component Value Range   Preg Test, Ur POSITIVE (*) NEGATIVE  HCG, QUANTITATIVE, PREGNANCY     Status: Abnormal   Collection Time   10/11/11  5:12 PM      Component Value Range   hCG, Beta Chain, Quant, S 221 (*) <5 mIU/mL  CBC     Status: Abnormal   Collection Time   10/11/11  5:12 PM      Component Value Range   WBC 11.2 (*) 4.0 - 10.5 K/uL   RBC 4.07  3.87 - 5.11 MIL/uL   Hemoglobin 12.5  12.0 - 15.0  g/dL   HCT 16.1  09.6 - 04.5 %   MCV 93.6  78.0 - 100.0 fL   MCH 30.7  26.0 - 34.0 pg   MCHC 32.8  30.0 - 36.0 g/dL   RDW 40.9  81.1 - 91.4 %   Platelets 212  150 - 400 K/uL  ABO/RH     Status: Normal (Preliminary result)   Collection Time   10/11/11  5:12 PM      Component Value Range   ABO/RH(D) A POS    WET PREP, GENITAL     Status: Abnormal   Collection Time   10/11/11  5:52 PM      Component Value Range   Yeast Wet Prep HPF POC NONE SEEN  NONE SEEN   Trich, Wet Prep NONE SEEN  NONE SEEN   Clue Cells Wet Prep HPF POC NONE SEEN  NONE SEEN   WBC, Wet Prep HPF POC FEW (*) NONE SEEN   Ultrasound: IMPRESSION:  Trace free pelvic fluid.  Small amount of complex fluid/blood within endometrial canal.  No intrauterine or extrauterine gestation identified.  With a quantitative beta HCG = 221, differential diagnosis includes  early intrauterine gestation too early to visualize, spontaneous  abortion, and ectopic pregnancy.  Serial quantitative beta HCG and/or follow-up ultrasound  recommended to definitively exclude ectopic pregnancy.  Dr. Vincente Poli saw pt in MAU and agrees with follow-up plan Assessment and Plan  Urinary Tract Infection Bleeding During Pregnancy  Plan: DC to home Repeat BHCG in 48 hours Macrobid x 7 days Urine sent for culture  [facility] 10/11/2011, 5:44 PM

## 2011-10-12 LAB — GC/CHLAMYDIA PROBE AMP, GENITAL: Chlamydia, DNA Probe: NEGATIVE

## 2011-10-14 LAB — URINE CULTURE: Colony Count: >=100000

## 2013-03-08 ENCOUNTER — Ambulatory Visit (INDEPENDENT_AMBULATORY_CARE_PROVIDER_SITE_OTHER): Payer: Medicare Other | Admitting: Family Medicine

## 2013-03-08 VITALS — BP 118/76 | HR 97 | Temp 98.5°F | Resp 18 | Ht 66.0 in | Wt 130.0 lb

## 2013-03-08 DIAGNOSIS — R21 Rash and other nonspecific skin eruption: Secondary | ICD-10-CM

## 2013-03-08 MED ORDER — TRIAMCINOLONE ACETONIDE 0.1 % EX CREA: 1.0000 "application " | TOPICAL_CREAM | Freq: Four times a day (QID) | CUTANEOUS | Status: DC

## 2013-03-08 MED ORDER — PERMETHRIN 5 % EX CREA: 1.0000 "application " | TOPICAL_CREAM | Freq: Once | CUTANEOUS | Status: DC

## 2013-03-08 NOTE — Patient Instructions (Signed)
Scabies Scabies are small bugs (mites) that burrow under the skin and cause red bumps and severe itching. These bugs can only be seen with a microscope. Scabies are highly contagious. They can spread easily from person to person by direct contact. They are also spread through sharing clothing or linens that have the scabies mites living in them. It is not unusual for an entire family to become infected through shared towels, clothing, or bedding.  HOME CARE INSTRUCTIONS   Your caregiver may prescribe a cream or lotion to kill the mites. If cream is prescribed, massage the cream into the entire body from the neck to the bottom of both feet. Also massage the cream into the scalp and face if your child is less than 1 year old. Avoid the eyes and mouth. Do not wash your hands after application.  Leave the cream on for 8 to 12 hours. Your child should bathe or shower after the 8 to 12 hour application period. Sometimes it is helpful to apply the cream to your child right before bedtime.  One treatment is usually effective and will eliminate approximately 95% of infestations. For severe cases, your caregiver may decide to repeat the treatment in 1 week. Everyone in your household should be treated with one application of the cream.  New rashes or burrows should not appear within 24 to 48 hours after successful treatment. However, the itching and rash may last for 2 to 4 weeks after successful treatment. Your caregiver may prescribe a medicine to help with the itching or to help the rash go away more quickly.  Scabies can live on clothing or linens for up to 3 days. All of your child's recently used clothing, towels, stuffed toys, and bed linens should be washed in hot water and then dried in a dryer for at least 20 minutes on high heat. Items that cannot be washed should be enclosed in a plastic bag for at least 3 days.  To help relieve itching, bathe your child in a cool bath or apply cool washcloths to the  affected areas.  Your child may return to school after treatment with the prescribed cream. SEEK MEDICAL CARE IF:   The itching persists longer than 4 weeks after treatment.  The rash spreads or becomes infected. Signs of infection include red blisters or yellow-tan crust. Document Released: 01/28/2005 Document Revised: 04/22/2011 Document Reviewed: 06/08/2008 [employer] Patient Information 2014 [employer]. Bedbugs Bedbugs are tiny bugs that live in and around beds. During the day, they hide in mattresses and other places near beds. They come out at night and bite people lying in bed. They need blood to live and grow. Bedbugs can be found in beds anywhere. Usually, they are found in places where many people come and go (hotels, shelters, hospitals). It does not matter whether the place is dirty or clean. Getting bitten by bedbugs rarely causes a medical problem. The biggest problem can be getting rid of them. This often takes the work of a pest control expert. CAUSES  Less use of pesticides. Bedbugs were common before the 1950s. Then, strong pesticides such as DDT nearly wiped them out. Today, these pesticides are not used because they harm the environment and can cause health problems.  More travel. Besides mattresses, bedbugs can also live in clothing and luggage. They can come along as people travel from place to place. Bedbugs are more common in certain parts of the world. When people travel to those areas, the bugs can come   home with them.  Presence of birds and bats. Bedbugs often infest birds and bats. If you have these animals in or near your home, bedbugs may infest your house, too. SYMPTOMS It does not hurt to be bitten by a bedbug. You will probably not wake up when you are bitten. Bedbugs usually bite areas of the skin that are not covered. Symptoms may show when you wake up, or they may take a day or more to show up. Symptoms may include:  Small red bumps on the skin. These  might be lined up in a row or clustered in a group.  A darker red dot in the middle of red bumps.  Blisters on the skin. There may be swelling and very bad itching. These may be signs of an allergic reaction. This does not happen often. DIAGNOSIS Bedbug bites might look and feel like other types of insect bites. The bugs do not stay on the body like ticks or lice. They bite, drop off, and crawl away to hide. Your caregiver will probably:  Ask about your symptoms.  Ask about your recent activities and travel.  Check your skin for bedbug bites.  Ask you to check at home for signs of bedbugs. You should look for:  Spots or stains on the bed or nearby. This could be from bedbugs that were crushed or from their eggs or waste.  Bedbugs themselves. They are reddish-brown, oval, and flat. They do not fly. They are about the size of an apple seed.  Places to look for bedbugs include:  Beds. Check mattresses, headboards, box springs, and bed frames.  On drapes and curtains near the bed.  Under carpeting in the bedroom.  Behind electrical outlets.  Behind any wallpaper that is peeling.  Inside luggage. TREATMENT Most bedbug bites do not need treatment. They usually go away on their own in a few days. The bites are not dangerous. However, treatment may be needed if you have scratched so much that your skin has become infected. You may also need treatment if you are allergic to bedbug bites. Treatment options include:  A drug that stops swelling and itching (corticosteroid). Usually, a cream is rubbed on the skin. If you have a bad rash, you may be given a corticosteroid pill.  Oral antihistamines. These are pills to help control itching.  Antibiotic medicines. An antibiotic may be prescribed for infected skin. HOME CARE INSTRUCTIONS   Take any medicine prescribed by your caregiver for your bites. Follow the directions carefully.  Consider wearing pajamas with long sleeves and pant  legs.  Your bedroom may need to be treated. A pest control expert should make sure the bedbugs are gone. You may need to throw away mattresses or luggage. Ask the pest control expert what you can do to keep the bedbugs from coming back. Common suggestions include:  Putting a plastic cover over your mattress.  Washing and drying your clothes and bedding in hot water and a hot dryer. The temperature should be hotter than 120 F (48.9 C). Bedbugs are killed by high temperatures.  Vacuuming carefully all around your bed. Vacuum in all cracks and crevices where the bugs might hide. Do this often.  Carefully checking all used furniture, bedding, or clothes that you bring into your house.  Eliminating bird nests and bat roosts.  If you get bedbug bites when traveling, check all your possessions carefully before bringing them into your house. If you find any bugs on clothes or in your  luggage, consider throwing those items away. SEEK MEDICAL CARE IF:  You have red bug bites that keep coming back.  You have red bug bites that itch badly.  You have bug bites that cause a skin rash.  You have scratch marks that are red and sore. SEEK IMMEDIATE MEDICAL CARE IF: You have a fever. Document Released: 03/02/2010 Document Revised: 04/22/2011 Document Reviewed: 03/02/2010 [employer] Patient Information 2014 [place], [place].

## 2013-03-08 NOTE — Progress Notes (Signed)
 Chief Complaint:  Chief Complaint  Patient presents with  . Rash    all over x 2 weeks ; needs work note    HPI: [name] is a 43 y.o. female who is here for  Rash x 2 weeks, itches, started on her arms and is on her stomach and in her groin area. Her boyfriend has it and so does his roommate, no one in her house actually has it. She deneis any new meds, lotions,sopas, foods, sexual partners. 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 Denies fevers, chills.  Past Medical History  Diagnosis Date  . DDD (degenerative disc disease)   . Acute hepatitis B   . Hepatitis C, acute   . COPD (chronic obstructive pulmonary disease)     July 2006   . Anxiety     treated with xanax   Past Surgical History  Procedure Laterality Date  . Breast surgery      1996 breast augmentation   History   Social History  . Marital Status: Divorced    Spouse Name: N/A    Number of Children: N/A  . Years of Education: N/A   Social History Main Topics  . Smoking status: Current Every Day Smoker -- 0.50 packs/day for 26 years  . Smokeless tobacco: Never Used  . Alcohol Use: No  . Drug Use: No  . Sexual Activity: Yes    Birth Control/ Protection: None   Other Topics Concern  . None   Social History Narrative  . None   History reviewed. No pertinent family history. Allergies  Allergen Reactions  . Codeine Shortness Of Breath and Swelling    Throat swelling  . Penicillins Shortness Of Breath and Swelling    Throat swelling  . Acetaminophen Other (See Comments)    Liver problem  . Crab [Shellfish Allergy] Swelling   Prior to Admission medications   Medication Sig Start Date End Date Taking? Authorizing Provider  ALPRAZolam Duanne Moron) 1 MG tablet Take 1 mg by mouth 4 (four) times daily as needed. For anxiety or sleep    Historical Provider, MD  gabapentin (NEURONTIN) 600 MG tablet Take 600 mg by mouth 3 (three) times daily.    Historical Provider, MD     ROS:  The patient denies fevers, chills, night sweats, unintentional weight loss, chest pain, palpitations, wheezing, dyspnea on exertion, nausea, vomiting, abdominal pain, dysuria, hematuria, melena, numbness, weakness, or tingling.   All other systems have been reviewed and were otherwise negative with the exception of those mentioned in the HPI and as above.    PHYSICAL EXAM: Filed Vitals:   03/08/13 1007  BP: 118/76  Pulse: 97  Temp: 98.5 F (36.9 C)  Resp: 18   Filed Vitals:   03/08/13 1007  Height: 5\' 6"  (1.676 m)  Weight: 130 lb (58.968 kg)   Body mass index is 20.99 kg/(m^2).  General: Alert, no acute distress HEENT:  Normocephalic, atraumatic, oropharynx patent. EOMI, PERRLA Cardiovascular:  Regular rate and rhythm, no rubs murmurs or gallops.  No Carotid bruits, radial pulse intact. No pedal edema.  Respiratory: Clear to auscultation bilaterally.  No wheezes, rales, or rhonchi.  No cyanosis, no use of accessory musculature GI: No organomegaly, abdomen is soft and non-tender, positive bowel sounds.  No masses. Skin: + erythematous maculopapular rash excoriated on arms, abdomen and also along groin inner thigh area, one papule in between web space,  Neurologic: Facial musculature  symmetric. Psychiatric: Patient is appropriate throughout our interaction. Lymphatic: No cervical lymphadenopathy Musculoskeletal: Gait intact.   LABS: Results for orders placed during the hospital encounter of 10/11/11  WET PREP, GENITAL      Result Value Range   Yeast Wet Prep HPF POC NONE SEEN  NONE SEEN   Trich, Wet Prep NONE SEEN  NONE SEEN   Clue Cells Wet Prep HPF POC NONE SEEN  NONE SEEN   WBC, Wet Prep HPF POC FEW (*) NONE SEEN  URINE CULTURE      Result Value Range   Specimen Description URINE, CATHETERIZED     Special Requests None     Culture  Setup Time 10/12/2011 05:35     Colony Count >=100,000 COLONIES/ML     Culture KBSIELLA PNEUMONIAE     Report Status 10/14/2011 FINAL       Organism ID, Bacteria KBSIELLA PNEUMONIAE    URINALYSIS, ROUTINE W REFX MICROSCOPIC      Result Value Range   Color, Urine YELLOW  YELLOW   APPearance HAZY (*) CAR   Specific Gravity, Urine >1.030 (*) 1.005 - 1.030   pH 6.0  5.0 - 8.0   Glucose, UA NEGATIVE  NEGATIVE mg/dL   Hgb urine dipstick LARGE (*) NEGATIVE   Bilirubin Urine NEGATIVE  NEGATIVE   Ketones, ur NEGATIVE  NEGATIVE mg/dL   Protein, ur NEGATIVE  NEGATIVE mg/dL   Urobilinogen, UA 0.2  0.0 - 1.0 mg/dL   Nitrite POSITIVE (*) NEGATIVE   Leukocytes, UA NEGATIVE  NEGATIVE  HCG, QUANTITATIVE, PREGNANCY      Result Value Range   hCG, Beta Chain, Quant, S 221 (*) <5 mIU/mL  CBC      Result Value Range   WBC 11.2 (*) 4.0 - 10.5 K/uL   RBC 4.07  3.87 - 5.11 MIL/uL   Hemoglobin 12.5  12.0 - 15.0 g/dL   HCT 38.1  36.0 - 46.0 %   MCV 93.6  78.0 - 100.0 fL   MCH 30.7  26.0 - 34.0 pg   MCHC 32.8  30.0 - 36.0 g/dL   RDW 13.6  11.5 - 15.5 %   Platelets 212  150 - 400 K/uL  URINE MICROSCOPIC-ADD ON      Result Value Range   Squamous Epithelial / LPF FEW (*) RARE   WBC, UA 0-2  <3 WBC/hpf   RBC / HPF 0-2  <3 RBC/hpf   Bacteria, UA MANY (*) RARE  GC/CHLAMYDIA PROBE AMP, GENITAL      Result Value Range   GC Probe Amp, Genital NEGATIVE  NEGATIVE   Chlamydia, DNA Probe NEGATIVE  NEGATIVE  POCT PREGNANCY, URINE      Result Value Range   Preg Test, Ur POSITIVE (*) NEGATIVE  ABO/RH      Result Value Range   ABO/RH(D) A POS       EKG/XRAY:   Primary read interpreted by Dr. Marin Comment at [facility].   ASSESSMENT/PLAN: Encounter Diagnosis  Name Primary?  . Rash and nonspecific skin eruption Yes   ? Bedbugs vs Scabies Rx Permetherin, triamcinolone cream May take benadry prn itch Gave her work note, advise to f/iu with liver specialist F/u prn  Gross sideeffects, risk and benefits, and alternatives of medications d/w patient. Patient is aware that all medications have potential sideeffects and we are unable to predict every  sideeffect or drug-drug interaction that may occur.  , [place], DO 03/08/2013 11:32 AM

## 2013-07-26 ENCOUNTER — Emergency Department (HOSPITAL_COMMUNITY)
Admission: EM | Admit: 2013-07-26 | Discharge: 2013-07-27 | Disposition: A | Discharge status: Home / Self Care | Payer: Medicare Other | Attending: Emergency Medicine | Admitting: Emergency Medicine

## 2013-07-26 ENCOUNTER — Emergency Department (HOSPITAL_COMMUNITY): Payer: Medicare Other

## 2013-07-26 DIAGNOSIS — R51 Headache: Secondary | ICD-10-CM | POA: Insufficient documentation

## 2013-07-26 DIAGNOSIS — F17200 Nicotine dependence, unspecified, uncomplicated: Secondary | ICD-10-CM | POA: Insufficient documentation

## 2013-07-26 DIAGNOSIS — Z8619 Personal history of other infectious and parasitic diseases: Secondary | ICD-10-CM | POA: Insufficient documentation

## 2013-07-26 DIAGNOSIS — Z79899 Other long term (current) drug therapy: Secondary | ICD-10-CM | POA: Insufficient documentation

## 2013-07-26 DIAGNOSIS — J449 Chronic obstructive pulmonary disease, unspecified: Secondary | ICD-10-CM | POA: Insufficient documentation

## 2013-07-26 DIAGNOSIS — F411 Generalized anxiety disorder: Secondary | ICD-10-CM | POA: Insufficient documentation

## 2013-07-26 DIAGNOSIS — R197 Diarrhea, unspecified: Secondary | ICD-10-CM | POA: Insufficient documentation

## 2013-07-26 DIAGNOSIS — R1110 Vomiting, unspecified: Secondary | ICD-10-CM | POA: Insufficient documentation

## 2013-07-26 DIAGNOSIS — R1011 Right upper quadrant pain: Secondary | ICD-10-CM | POA: Insufficient documentation

## 2013-07-26 DIAGNOSIS — Z8739 Personal history of other diseases of the musculoskeletal system and connective tissue: Secondary | ICD-10-CM | POA: Insufficient documentation

## 2013-07-26 DIAGNOSIS — R109 Unspecified abdominal pain: Secondary | ICD-10-CM

## 2013-07-26 DIAGNOSIS — J4489 Other specified chronic obstructive pulmonary disease: Secondary | ICD-10-CM | POA: Insufficient documentation

## 2013-07-26 LAB — COMPREHENSIVE METABOLIC PANEL
ALBUMIN: 4.1 g/dL (ref 3.5–5.2)
ALT: 27 U/L (ref 0–35)
AST: 36 U/L (ref 0–37)
Alkaline Phosphatase: 68 U/L (ref 39–117)
BILIRUBIN TOTAL: 0.5 mg/dL (ref 0.3–1.2)
BUN: 5 mg/dL — ABNORMAL LOW (ref 6–23)
CHLORIDE: 99 meq/L (ref 96–112)
CO2: 26 mEq/L (ref 19–32)
Calcium: 9.7 mg/dL (ref 8.4–10.5)
Creatinine, Ser: 0.52 mg/dL (ref 0.50–1.10)
GFR calc Af Amer: >90 mL/min (ref >90)
GFR calc non Af Amer: >90 mL/min (ref >90)
Glucose, Bld: 90 mg/dL (ref 70–99)
POTASSIUM: 4 meq/L (ref 3.7–5.3)
SODIUM: 138 meq/L (ref 137–147)
TOTAL PROTEIN: 6.9 g/dL (ref 6.0–8.3)

## 2013-07-26 LAB — CBC WITH DIFFERENTIAL/PLATELET
BASOS ABS: 0 10*3/uL (ref 0.0–0.1)
Basophils Relative: 1 % (ref 0–1)
EOS ABS: 0.2 10*3/uL (ref 0.0–0.7)
Eosinophils Relative: 3 % (ref 0–5)
HCT: 44.5 % (ref 36.0–46.0)
HEMOGLOBIN: 15.3 g/dL — AB (ref 12.0–15.0)
Lymphocytes Relative: 28 % (ref 12–46)
Lymphs Abs: 2.3 10*3/uL (ref 0.7–4.0)
MCH: 33.3 pg (ref 26.0–34.0)
MCHC: 34.4 g/dL (ref 30.0–36.0)
MCV: 96.7 fL (ref 78.0–100.0)
MONOS PCT: 8 % (ref 3–12)
Monocytes Absolute: 0.7 10*3/uL (ref 0.1–1.0)
NEUTROS ABS: 4.8 10*3/uL (ref 1.7–7.7)
NEUTROS PCT: 60 % (ref 43–77)
PLATELETS: 209 10*3/uL (ref 150–400)
RBC: 4.6 MIL/uL (ref 3.87–5.11)
RDW: 13.1 % (ref 11.5–15.5)
WBC: 8 10*3/uL (ref 4.0–10.5)

## 2013-07-26 LAB — URINALYSIS, ROUTINE W REFLEX MICROSCOPIC
Bilirubin Urine: NEGATIVE
Glucose, UA: NEGATIVE mg/dL
Hgb urine dipstick: NEGATIVE
Ketones, ur: NEGATIVE mg/dL
Nitrite: NEGATIVE
PROTEIN: NEGATIVE mg/dL
Specific Gravity, Urine: 1.004 — ABNORMAL LOW (ref 1.005–1.030)
Urobilinogen, UA: 1 mg/dL (ref 0.0–1.0)
pH: 7 (ref 5.0–8.0)

## 2013-07-26 LAB — URINE MICROSCOPIC-ADD ON

## 2013-07-26 LAB — I-STAT TROPONIN, ED: Troponin i, poc: 0 ng/mL (ref 0.00–0.08)

## 2013-07-26 LAB — LIPASE, BLOOD: LIPASE: 38 U/L (ref 11–59)

## 2013-07-26 MED ORDER — OXYCODONE HCL 5 MG PO TABS: 5.0000 mg | ORAL_TABLET | ORAL | Status: DC | PRN

## 2013-07-26 MED ORDER — ONDANSETRON HCL 4 MG PO TABS: 4.0000 mg | ORAL_TABLET | Freq: Four times a day (QID) | ORAL | Status: DC

## 2013-07-26 MED ORDER — SODIUM CHLORIDE 0.9 % IV BOLUS (SEPSIS)
1000.0000 mL | Freq: Once | INTRAVENOUS | Status: AC
  Administered 2013-07-26: 1000 mL via INTRAVENOUS

## 2013-07-26 MED ORDER — ONDANSETRON HCL 4 MG/2ML IJ SOLN
4.0000 mg | Freq: Once | INTRAMUSCULAR | Status: AC
  Administered 2013-07-26: 4 mg via INTRAVENOUS
  Filled 2013-07-26: qty 2

## 2013-07-26 MED ORDER — FENTANYL CITRATE 0.05 MG/ML IJ SOLN
50.0000 ug | Freq: Once | INTRAMUSCULAR | Status: AC
  Administered 2013-07-26: 50 ug via INTRAVENOUS
  Filled 2013-07-26: qty 2

## 2013-07-26 NOTE — ED Provider Notes (Signed)
TIME SEEN: 8:35 PM  CHIEF COMPLAINT: Right upper quadrant abdominal pain, vomiting, subjective fever  HPI: Patient is a 43 year old female with history of hepatitis B, hepatitis C, COPD who presents to the emergency department with complaints of right upper quadrant abdominal pain, subjective fevers and chills, vomiting, diarrhea that started 4 days ago. She states she's also noticed "yellowing of my skin". No sick contacts or recent travel or antibiotic use. No recent hospitalization. No prior abdominal surgery. Denies a history of Tylenol or alcohol use.  ROS: See HPI Constitutional: no fever  Eyes: no drainage  ENT: no runny nose   Cardiovascular:  no chest pain  Resp: no SOB  GI: no vomiting GU: no dysuria Integumentary: no rash  Allergy: no hives  Musculoskeletal: no leg swelling  Neurological: no slurred speech ROS otherwise negative  PAST MEDICAL HISTORY/PAST SURGICAL HISTORY:  Past Medical History  Diagnosis Date  . DDD (degenerative disc disease)   . Acute hepatitis B   . Hepatitis C, acute   . COPD (chronic obstructive pulmonary disease)     July 2006   . Anxiety     treated with xanax    MEDICATIONS:  Prior to Admission medications   Medication Sig Start Date End Date Taking? Authorizing Provider  ALPRAZolam Duanne Moron) 1 MG tablet Take 1 mg by mouth 4 (four) times daily as needed. For anxiety or sleep   Yes Historical Provider, MD  amphetamine-dextroamphetamine (ADDERALL XR) 20 MG 24 hr capsule Take 20 mg by mouth daily.   Yes Historical Provider, MD    ALLERGIES:  Allergies  Allergen Reactions  . Codeine Shortness Of Breath and Swelling    Throat swelling  . Penicillins Shortness Of Breath and Swelling    Throat swelling  . Acetaminophen Other (See Comments)    Liver problem  . Crab [Shellfish Allergy] Swelling    SOCIAL HISTORY:  History  Substance Use Topics  . Smoking status: Current Every Day Smoker -- 0.50 packs/day for 26 years  . Smokeless  tobacco: Never Used  . Alcohol Use: No    FAMILY HISTORY: No family history on file.  EXAM: BP 136/107  Pulse 100  Temp(Src) 98.1 F (36.7 C) (Oral)  Resp 15  SpO2 100% CONSTITUTIONAL: Alert and oriented and responds appropriately to questions. Well-appearing; well-nourished HEAD: Normocephalic EYES: Conjunctivae clear, PERRL ENT: normal nose; no rhinorrhea; moist mucous membranes; pharynx without lesions noted NECK: Supple, no meningismus, no LAD  CARD: RRR; S1 and S2 appreciated; no murmurs, no clicks, no rubs, no gallops RESP: Normal chest excursion without splinting or tachypnea; breath sounds clear and equal bilaterally; no wheezes, no rhonchi, no rales,  ABD/GI: Normal bowel sounds; non-distended; soft, mildly tender to palpation in the right upper quadrant, negative Murphy sign, no rebound or guarding, no peritoneal signs BACK:  The back appears normal and is non-tender to palpation, there is no CVA tenderness EXT: Normal ROM in all joints; non-tender to palpation; no edema; normal capillary refill; no cyanosis    SKIN: Normal color for age and race; warm, no jaundice NEURO: Moves all extremities equally PSYCH: The patient's mood and manner are appropriate. Grooming and personal hygiene are appropriate.  MEDICAL DECISION MAKING: Patient here with complaints that she states feels like her liver failure that she's had the past from hepatitis C and B. Her abdominal exam is benign. Her labs are completely normal. There is no elevation of her LFTs, lipase. She has no jaundice or scleral icterus on exam. No abdominal  distention, fluid wave or signs of ascites. Patient has no leukocytosis. Her troponin is also negative. EKG shows no ischemic changes. Given she still has her gallbladder and right upper quadrant abdominal pain, will obtain a abdominal ultrasound. We'll give pain and nausea medication. Differential diagnosis includes cholelithiasis, less likely cholecystitis, gastritis,  peptic ulcer disease. Unlikely ACS. She is a troponin and EKG with 4 days of constant pain. Patient has no risk factors for pulmonary embolus. Doubt dissection given she has no radiation of pain into her back and appears very comfortable on exam.  ED PROGRESS: Patient's ultrasound is negative. She states she's feeling much better after fentanyl and and is ready for discharge home. Discussed strict return precautions and supportive care instructions. Patient verbalizes understanding and is comfortable plan. I do not feel any life-threatening illness is present.   EKG Interpretation  Date/Time:  Monday July 26 2013 18:50:14 EDT Ventricular Rate:  86 PR Interval:  140 QRS Duration: 94 QT Interval:  363 QTC Calculation: 434 R Axis:   76 Text Interpretation:  Sinus rhythm Confirmed by Sheronda Parran,  DO, Zygmunt Mcglinn [id number]) on 07/26/2013 8:39:54 PM         [place], DO 07/27/13 [id number]

## 2013-07-26 NOTE — Discharge Instructions (Signed)
Abdominal Pain, Women °Abdominal (stomach, pelvic, or belly) pain can be caused by many things. It is important to tell your doctor: °· The location of the pain. °· Does it come and go or is it present all the time? °· Are there things that start the pain (eating certain foods, exercise)? °· Are there other symptoms associated with the pain (fever, nausea, vomiting, diarrhea)? °All of this is helpful to know when trying to find the cause of the pain. °CAUSES  °· Stomach: virus or bacteria infection, or ulcer. °· Intestine: appendicitis (inflamed appendix), regional ileitis (Crohn's disease), ulcerative colitis (inflamed colon), irritable bowel syndrome, diverticulitis (inflamed diverticulum of the colon), or cancer of the stomach or intestine. °· Gallbladder disease or stones in the gallbladder. °· Kidney disease, kidney stones, or infection. °· Pancreas infection or cancer. °· Fibromyalgia (pain disorder). °· Diseases of the female organs: °· Uterus: fibroid (non-cancerous) tumors or infection. °· Fallopian tubes: infection or tubal pregnancy. °· Ovary: cysts or tumors. °· Pelvic adhesions (scar tissue). °· Endometriosis (uterus lining tissue growing in the pelvis and on the pelvic organs). °· Pelvic congestion syndrome (female organs filling up with blood just before the menstrual period). °· Pain with the menstrual period. °· Pain with ovulation (producing an egg). °· Pain with an IUD (intrauterine device, birth control) in the uterus. °· Cancer of the female organs. °· Functional pain (pain not caused by a disease, may improve without treatment). °· Psychological pain. °· Depression. °DIAGNOSIS  °Your doctor will decide the seriousness of your pain by doing an examination. °· Blood tests. °· X-rays. °· Ultrasound. °· CT scan (computed tomography, special type of X-ray). °· MRI (magnetic resonance imaging). °· Cultures, for infection. °· Barium enema (dye inserted in the large intestine, to better view it with  X-rays). °· Colonoscopy (looking in intestine with a lighted tube). °· Laparoscopy (minor surgery, looking in abdomen with a lighted tube). °· Major abdominal exploratory surgery (looking in abdomen with a large incision). °TREATMENT  °The treatment will depend on the cause of the pain.  °· Many cases can be observed and treated at home. °· Over-the-counter medicines recommended by your caregiver. °· Prescription medicine. °· Antibiotics, for infection. °· Birth control pills, for painful periods or for ovulation pain. °· Hormone treatment, for endometriosis. °· Nerve blocking injections. °· Physical therapy. °· Antidepressants. °· Counseling with a psychologist or psychiatrist. °· Minor or major surgery. °HOME CARE INSTRUCTIONS  °· Do not take laxatives, unless directed by your caregiver. °· Take over-the-counter pain medicine only if ordered by your caregiver. Do not take aspirin because it can cause an upset stomach or bleeding. °· Try a clear liquid diet (broth or water) as ordered by your caregiver. Slowly move to a bland diet, as tolerated, if the pain is related to the stomach or intestine. °· Have a thermometer and take your temperature several times a day, and record it. °· Bed rest and sleep, if it helps the pain. °· Avoid sexual intercourse, if it causes pain. °· Avoid stressful situations. °· Keep your follow-up appointments and tests, as your caregiver orders. °· If the pain does not go away with medicine or surgery, you may try: °· Acupuncture. °· Relaxation exercises (yoga, meditation). °· Group therapy. °· Counseling. °SEEK MEDICAL CARE IF:  °· You notice certain foods cause stomach pain. °· Your home care treatment is not helping your pain. °· You need stronger pain medicine. °· You want your IUD removed. °· You feel faint or   lightheaded.  You develop nausea and vomiting.  You develop a rash.  You are having side effects or an allergy to your medicine. SEEK IMMEDIATE MEDICAL CARE IF:   Your  pain does not go away or gets worse.  You have a fever.  Your pain is felt only in portions of the abdomen. The right side could possibly be appendicitis. The left lower portion of the abdomen could be colitis or diverticulitis.  You are passing blood in your stools (bright red or black tarry stools, with or without vomiting).  You have blood in your urine.  You develop chills, with or without a fever.  You pass out. MAKE SURE YOU:   Understand these instructions.  Will watch your condition.  Will get help right away if you are not doing well or get worse. Document Released: 11/25/2006 Document Revised: 04/22/2011 Document Reviewed: 12/15/2008 [facility] Patient Information 2014 [place], [place]. Nausea and Vomiting Nausea is a sick feeling that often comes before throwing up (vomiting). Vomiting is a reflex where stomach contents come out of your mouth. Vomiting can cause severe loss of body fluids (dehydration). Children and elderly adults can become dehydrated quickly, especially if they also have diarrhea. Nausea and vomiting are symptoms of a condition or disease. It is important to find the cause of your symptoms. CAUSES   Direct irritation of the stomach lining. This irritation can result from increased acid production (gastroesophageal reflux disease), infection, food poisoning, taking certain medicines (such as nonsteroidal anti-inflammatory drugs), alcohol use, or tobacco use.  Signals from the brain.These signals could be caused by a headache, heat exposure, an inner ear disturbance, increased pressure in the brain from injury, infection, a tumor, or a concussion, pain, emotional stimulus, or metabolic problems.  An obstruction in the gastrointestinal tract (bowel obstruction).  Illnesses such as diabetes, hepatitis, gallbladder problems, appendicitis, kidney problems, cancer, sepsis, atypical symptoms of a heart attack, or eating disorders.  Medical treatments such as  chemotherapy and radiation.  Receiving medicine that makes you sleep (general anesthetic) during surgery. DIAGNOSIS Your caregiver may ask for tests to be done if the problems do not improve after a few days. Tests may also be done if symptoms are severe or if the reason for the nausea and vomiting is not clear. Tests may include:  Urine tests.  Blood tests.  Stool tests.  Cultures (to look for evidence of infection).  X-rays or other imaging studies. Test results can help your caregiver make decisions about treatment or the need for additional tests. TREATMENT You need to stay well hydrated. Drink frequently but in small amounts.You may wish to drink water, sports drinks, clear broth, or eat frozen ice pops or gelatin dessert to help stay hydrated.When you eat, eating slowly may help prevent nausea.There are also some antinausea medicines that may help prevent nausea. HOME CARE INSTRUCTIONS   Take all medicine as directed by your caregiver.  If you do not have an appetite, do not force yourself to eat. However, you must continue to drink fluids.  If you have an appetite, eat a normal diet unless your caregiver tells you differently.  Eat a variety of complex carbohydrates (rice, wheat, potatoes, bread), lean meats, yogurt, fruits, and vegetables.  Avoid high-fat foods because they are more difficult to digest.  Drink enough water and fluids to keep your urine clear or pale yellow.  If you are dehydrated, ask your caregiver for specific rehydration instructions. Signs of dehydration may include:  Severe thirst.  Dry  lips and mouth.  Dizziness.  Dark urine.  Decreasing urine frequency and amount.  Confusion.  Rapid breathing or pulse. SEEK IMMEDIATE MEDICAL CARE IF:   You have blood or brown flecks (like coffee grounds) in your vomit.  You have black or bloody stools.  You have a severe headache or stiff neck.  You are confused.  You have severe abdominal  pain.  You have chest pain or trouble breathing.  You do not urinate at least once every 8 hours.  You develop cold or clammy skin.  You continue to vomit for longer than 24 to 48 hours.  You have a fever. MAKE SURE YOU:   Understand these instructions.  Will watch your condition.  Will get help right away if you are not doing well or get worse. Document Released: 01/28/2005 Document Revised: 04/22/2011 Document Reviewed: 06/27/2010 [facility] Patient Information 2014 [place], [place].

## 2013-07-26 NOTE — ED Notes (Addendum)
Pt with Hx of Hep C and B c/of right side pain, epigastric pain, headache, jaundice, n/v, diarrhea, confusion, blurred vision, swelling in stomach.

## 2013-08-27 ENCOUNTER — Encounter (HOSPITAL_COMMUNITY): Payer: Self-pay | Admitting: *Deleted

## 2013-08-27 ENCOUNTER — Inpatient Hospital Stay (HOSPITAL_COMMUNITY)
Admission: AD | Admit: 2013-08-27 | Discharge: 2013-08-28 | Disposition: A | Discharge status: Home / Self Care | Payer: Medicare Other | Source: Ambulatory Visit | Attending: Obstetrics and Gynecology | Admitting: Obstetrics and Gynecology

## 2013-08-27 DIAGNOSIS — B1710 Acute hepatitis C without hepatic coma: Secondary | ICD-10-CM | POA: Insufficient documentation

## 2013-08-27 DIAGNOSIS — N949 Unspecified condition associated with female genital organs and menstrual cycle: Secondary | ICD-10-CM | POA: Insufficient documentation

## 2013-08-27 DIAGNOSIS — N938 Other specified abnormal uterine and vaginal bleeding: Secondary | ICD-10-CM | POA: Insufficient documentation

## 2013-08-27 DIAGNOSIS — J449 Chronic obstructive pulmonary disease, unspecified: Secondary | ICD-10-CM | POA: Insufficient documentation

## 2013-08-27 DIAGNOSIS — R109 Unspecified abdominal pain: Secondary | ICD-10-CM | POA: Insufficient documentation

## 2013-08-27 DIAGNOSIS — N83209 Unspecified ovarian cyst, unspecified side: Secondary | ICD-10-CM | POA: Diagnosis not present

## 2013-08-27 DIAGNOSIS — N390 Urinary tract infection, site not specified: Secondary | ICD-10-CM | POA: Insufficient documentation

## 2013-08-27 DIAGNOSIS — F17200 Nicotine dependence, unspecified, uncomplicated: Secondary | ICD-10-CM | POA: Insufficient documentation

## 2013-08-27 DIAGNOSIS — J4489 Other specified chronic obstructive pulmonary disease: Secondary | ICD-10-CM | POA: Insufficient documentation

## 2013-08-27 DIAGNOSIS — B1910 Unspecified viral hepatitis B without hepatic coma: Secondary | ICD-10-CM | POA: Diagnosis not present

## 2013-08-27 DIAGNOSIS — IMO0002 Reserved for concepts with insufficient information to code with codable children: Secondary | ICD-10-CM | POA: Diagnosis not present

## 2013-08-27 DIAGNOSIS — N925 Other specified irregular menstruation: Secondary | ICD-10-CM | POA: Insufficient documentation

## 2013-08-27 DIAGNOSIS — N3000 Acute cystitis without hematuria: Secondary | ICD-10-CM

## 2013-08-27 LAB — URINALYSIS, ROUTINE W REFLEX MICROSCOPIC
BILIRUBIN URINE: NEGATIVE
Glucose, UA: NEGATIVE mg/dL
KETONES UR: NEGATIVE mg/dL
Nitrite: POSITIVE — AB
PH: 7 (ref 5.0–8.0)
Protein, ur: NEGATIVE mg/dL
Specific Gravity, Urine: 1.01 (ref 1.005–1.030)
UROBILINOGEN UA: 4 mg/dL — AB (ref 0.0–1.0)

## 2013-08-27 LAB — CBC WITH DIFFERENTIAL/PLATELET
Basophils Absolute: 0 10*3/uL (ref 0.0–0.1)
Basophils Relative: 0 % (ref 0–1)
EOS PCT: 3 % (ref 0–5)
Eosinophils Absolute: 0.4 10*3/uL (ref 0.0–0.7)
HCT: 41.7 % (ref 36.0–46.0)
HEMOGLOBIN: 14.2 g/dL (ref 12.0–15.0)
LYMPHS ABS: 1.9 10*3/uL (ref 0.7–4.0)
Lymphocytes Relative: 14 % (ref 12–46)
MCH: 33.6 pg (ref 26.0–34.0)
MCHC: 34.1 g/dL (ref 30.0–36.0)
MCV: 98.8 fL (ref 78.0–100.0)
MONOS PCT: 8 % (ref 3–12)
Monocytes Absolute: 1.2 10*3/uL — ABNORMAL HIGH (ref 0.1–1.0)
Neutro Abs: 10.6 10*3/uL — ABNORMAL HIGH (ref 1.7–7.7)
Neutrophils Relative %: 75 % (ref 43–77)
Platelets: 232 10*3/uL (ref 150–400)
RBC: 4.22 MIL/uL (ref 3.87–5.11)
RDW: 13.4 % (ref 11.5–15.5)
WBC: 14.1 10*3/uL — ABNORMAL HIGH (ref 4.0–10.5)

## 2013-08-27 LAB — POCT PREGNANCY, URINE: PREG TEST UR: NEGATIVE

## 2013-08-27 LAB — BASIC METABOLIC PANEL
Anion gap: 9 (ref 5–15)
BUN: 6 mg/dL (ref 6–23)
CALCIUM: 9.1 mg/dL (ref 8.4–10.5)
CO2: 29 mEq/L (ref 19–32)
CREATININE: 0.6 mg/dL (ref 0.50–1.10)
Chloride: 102 mEq/L (ref 96–112)
GLUCOSE: 95 mg/dL (ref 70–99)
Potassium: 4.5 mEq/L (ref 3.7–5.3)
Sodium: 140 mEq/L (ref 137–147)

## 2013-08-27 LAB — URINE MICROSCOPIC-ADD ON

## 2013-08-27 MED ORDER — KETOROLAC TROMETHAMINE 60 MG/2ML IM SOLN
30.0000 mg | Freq: Once | INTRAMUSCULAR | Status: AC
  Administered 2013-08-28: 30 mg via INTRAMUSCULAR
  Filled 2013-08-27: qty 2

## 2013-08-27 NOTE — MAU Provider Note (Signed)
CSN: [id number]     Arrival date & time 08/27/13  2240 History   None    Chief Complaint  Patient presents with  . Vaginal Bleeding  . Abdominal Cramping  . Back Pain     (Consider location/radiation/quality/duration/timing/severity/associated sxs/prior Treatment) Patient is a 43 y.o. female presenting with vaginal bleeding, cramps, and back pain. The history is provided by the patient.  Vaginal Bleeding This is a new problem. The current episode started in the past 7 days. The problem occurs constantly. The problem has been gradually worsening. Associated symptoms include abdominal pain, chills and a fever. Pertinent negatives include no chest pain, coughing, nausea, rash, swollen glands or vomiting. Nothing aggravates the symptoms. She has tried nothing for the symptoms.  Abdominal Cramping The primary symptoms of the illness include abdominal pain, fever, vaginal discharge and vaginal bleeding. The primary symptoms of the illness do not include shortness of breath, nausea, vomiting or dysuria.  The vaginal discharge is not associated with dysuria.   Additional symptoms associated with the illness include chills and back pain. Symptoms associated with the illness do not include frequency.  Back Pain  Associated symptoms include a fever, abdominal pain and pelvic pain. Pertinent negatives include no chest pain and no dysuria.   [name] is a [id number] who presents to the MAU with heavy vaginal bleeding. She states that she had a period July 3rd but was lighter than usually. Last normal period 06/13/2013, in June had spotting. This bleeding started 3 days ago with clots and cramping. She does not use birth control and wonders if she might be pregnant. She felt like she may have had some fever but did not take her temperature. PMH significant for Hepatitis B and C, COPD, anxiety and chronic pain from DDD. She can not take tylenol due to her Hepatitis.   Past Medical History  Diagnosis  Date  . DDD (degenerative disc disease)   . Acute hepatitis B   . Hepatitis C, acute   . COPD (chronic obstructive pulmonary disease)     July 2006   . Anxiety     treated with xanax   Past Surgical History  Procedure Laterality Date  . Breast surgery      1996 breast augmentation   Family History  Problem Relation Age of Onset  . Cancer Mother   . Cancer Sister    History  Substance Use Topics  . Smoking status: Current Every Day Smoker -- 0.50 packs/day for 26 years  . Smokeless tobacco: Never Used  . Alcohol Use: No   OB History   Grav Para Term Preterm Abortions TAB SAB Ect Mult Living   5 2 2  3 2 1   2      Review of Systems  Constitutional: Positive for fever and chills.  Eyes: Negative for pain and redness.  Respiratory: Negative for cough, shortness of breath and wheezing.   Cardiovascular: Negative for chest pain.  Gastrointestinal: Positive for abdominal pain. Negative for nausea and vomiting.  Genitourinary: Positive for vaginal bleeding, vaginal discharge and pelvic pain. Negative for dysuria and frequency.  Musculoskeletal: Positive for back pain.  Skin: Negative for rash.  Neurological: Positive for light-headedness.  Psychiatric/Behavioral: Negative for confusion. The patient is not nervous/anxious.       Allergies  Codeine; Penicillins; Acetaminophen; and Crab  Home Medications   Prior to Admission medications   Medication Sig Start Date End Date Taking? Authorizing Provider  ALPRAZolam Duanne Moron) 1 MG tablet Take 1  mg by mouth 4 (four) times daily as needed. For anxiety or sleep   Yes Historical Provider, MD  amphetamine-dextroamphetamine (ADDERALL XR) 20 MG 24 hr capsule Take 20 mg by mouth daily.   Yes Historical Provider, MD  ondansetron (ZOFRAN) 4 MG tablet Take 1 tablet (4 mg total) by mouth every 6 (six) hours. 07/26/13   Kristen N Ward, DO  oxyCODONE (ROXICODONE) 5 MG immediate release tablet Take 1 tablet (5 mg total) by mouth every 4 (four)  hours as needed for severe pain. 07/26/13   Kristen N Ward, DO   BP 124/79  Pulse 91  Temp(Src) 98.7 F (37.1 C)  Resp 20  Ht 5\' 6"  (1.676 m)  Wt 129 lb 9.6 oz (58.786 kg)  BMI 20.93 kg/m2  SpO2 100%  LMP 06/13/2013 Physical Exam  Nursing note and vitals reviewed. Constitutional: She is oriented to person, place, and time. She appears well-developed and well-nourished. No distress.  HENT:  Head: Normocephalic and atraumatic.  Eyes: Conjunctivae and EOM are normal.  Neck: Neck supple.  Cardiovascular: Normal rate and regular rhythm.   Pulmonary/Chest: Effort normal and breath sounds normal.  Abdominal: Soft. Normal appearance and bowel sounds are normal. There is tenderness in the right lower quadrant and left lower quadrant. There is no rebound, no guarding and no CVA tenderness.  Genitourinary:  External genitalia without lesions, scant blood vaginal vault. Positive CMT, bilateral adnexal tenderness, uterus without palpable enlargement.   Musculoskeletal: Normal range of motion.  Neurological: She is alert and oriented to person, place, and time. No cranial nerve deficit.  Skin: Skin is warm and dry.  Psychiatric: She has a normal mood and affect. Her behavior is normal.   Results for orders placed during the hospital encounter of 08/27/13 (from the past 24 hour(s))  URINALYSIS, ROUTINE W REFLEX MICROSCOPIC     Status: Abnormal   Collection Time    08/27/13 10:56 PM      Result Value Ref Range   Color, Urine YELLOW  YELLOW   APPearance CLEAR  CLEAR   Specific Gravity, Urine 1.010  1.005 - 1.030   pH 7.0  5.0 - 8.0   Glucose, UA NEGATIVE  NEGATIVE mg/dL   Hgb urine dipstick LARGE (*) NEGATIVE   Bilirubin Urine NEGATIVE  NEGATIVE   Ketones, ur NEGATIVE  NEGATIVE mg/dL   Protein, ur NEGATIVE  NEGATIVE mg/dL   Urobilinogen, UA 4.0 (*) 0.0 - 1.0 mg/dL   Nitrite POSITIVE (*) NEGATIVE   Leukocytes, UA SMALL (*) NEGATIVE  URINE MICROSCOPIC-ADD ON     Status: Abnormal    Collection Time    08/27/13 10:56 PM      Result Value Ref Range   Squamous Epithelial / LPF FEW (*) RARE   WBC, UA 3-6  <3 WBC/hpf   RBC / HPF 7-10  <3 RBC/hpf   Bacteria, UA MANY (*) RARE  CBC WITH DIFFERENTIAL     Status: Abnormal   Collection Time    08/27/13 11:15 PM      Result Value Ref Range   WBC 14.1 (*) 4.0 - 10.5 K/uL   RBC 4.22  3.87 - 5.11 MIL/uL   Hemoglobin 14.2  12.0 - 15.0 g/dL   HCT 41.7  36.0 - 46.0 %   MCV 98.8  78.0 - 100.0 fL   MCH 33.6  26.0 - 34.0 pg   MCHC 34.1  30.0 - 36.0 g/dL   RDW 13.4  11.5 - 15.5 %   Platelets 232  150 - 400 K/uL   Neutrophils Relative % 75  43 - 77 %   Neutro Abs 10.6 (*) 1.7 - 7.7 K/uL   Lymphocytes Relative 14  12 - 46 %   Lymphs Abs 1.9  0.7 - 4.0 K/uL   Monocytes Relative 8  3 - 12 %   Monocytes Absolute 1.2 (*) 0.1 - 1.0 K/uL   Eosinophils Relative 3  0 - 5 %   Eosinophils Absolute 0.4  0.0 - 0.7 K/uL   Basophils Relative 0  0 - 1 %   Basophils Absolute 0.0  0.0 - 0.1 K/uL  BASIC METABOLIC PANEL     Status: None   Collection Time    08/27/13 11:15 PM      Result Value Ref Range   Sodium 140  137 - 147 mEq/L   Potassium 4.5  3.7 - 5.3 mEq/L   Chloride 102  96 - 112 mEq/L   CO2 29  19 - 32 mEq/L   Glucose, Bld 95  70 - 99 mg/dL   BUN 6  6 - 23 mg/dL   Creatinine, Ser 0.60  0.50 - 1.10 mg/dL   Calcium 9.1  8.4 - 10.5 mg/dL   GFR calc non Af Amer >90  >90 mL/min   GFR calc Af Amer >90  >90 mL/min   Anion gap 9  5 - 15  POCT PREGNANCY, URINE     Status: None   Collection Time    08/27/13 11:27 PM      Result Value Ref Range   Preg Test, Ur NEGATIVE  NEGATIVE  WET PREP, GENITAL     Status: Abnormal   Collection Time    08/27/13 11:45 PM      Result Value Ref Range   Yeast Wet Prep HPF POC NONE SEEN  NONE SEEN   Trich, Wet Prep NONE SEEN  NONE SEEN   Clue Cells Wet Prep HPF POC FEW (*) NONE SEEN   WBC, Wet Prep HPF POC FEW (*) NONE SEEN    US Transvaginal Non-ob  08/28/2013   CLINICAL DATA:  Pelvic pain,  vaginal bleeding  EXAM: TRANSABDOMINAL AND TRANSVAGINAL ULTRASOUND OF PELVIS  TECHNIQUE: Both transabdominal and transvaginal ultrasound examinations of the pelvis were performed. Transabdominal technique was performed for global imaging of the pelvis including uterus, ovaries, adnexal regions, and pelvic cul-de-sac. It was necessary to proceed with endovaginal exam following the transabdominal exam to visualize the endometrium and ovaries.  COMPARISON:  None  FINDINGS: Uterus  Measurements: 7.9 x 3.9 x 5.6 cm. No fibroids or other mass visualized.  Endometrium  Thickness: 5.2 mm.  No focal abnormality visualized.  Right ovary  Measurements: 7.1 x 3.3 x 4.1 cm. There is a 3.7 x 2.6 x 2.4 cm complex cystic structure with multiple thin internal septations and a focal area of hyper echogenicity.  Left ovary  Measurements: 4.4 x 2.5 x 3 cm. Normal appearance/no adnexal mass.  Other findings  No free fluid.  IMPRESSION: 1. Complex cystic right ovarian mass measuring 3.7 x 2.6 x 2.4 cm. Recommend GYN consultation. 2. No uterine fibroids identified.   Electronically Signed   By: Kathreen Devoid   On: 08/28/2013 01:02   US Pelvis Complete  08/28/2013   CLINICAL DATA:  Pelvic pain, vaginal bleeding  EXAM: TRANSABDOMINAL AND TRANSVAGINAL ULTRASOUND OF PELVIS  TECHNIQUE: Both transabdominal and transvaginal ultrasound examinations of the pelvis were performed. Transabdominal technique was performed for global imaging of the pelvis including uterus,  ovaries, adnexal regions, and pelvic cul-de-sac. It was necessary to proceed with endovaginal exam following the transabdominal exam to visualize the endometrium and ovaries.  COMPARISON:  None  FINDINGS: Uterus  Measurements: 7.9 x 3.9 x 5.6 cm. No fibroids or other mass visualized.  Endometrium  Thickness: 5.2 mm.  No focal abnormality visualized.  Right ovary  Measurements: 7.1 x 3.3 x 4.1 cm. There is a 3.7 x 2.6 x 2.4 cm complex cystic structure with multiple thin internal  septations and a focal area of hyper echogenicity.  Left ovary  Measurements: 4.4 x 2.5 x 3 cm. Normal appearance/no adnexal mass.  Other findings  No free fluid.  IMPRESSION: 1. Complex cystic right ovarian mass measuring 3.7 x 2.6 x 2.4 cm. Recommend GYN consultation. 2. No uterine fibroids identified.   Electronically Signed   By: Kathreen Devoid   On: 08/28/2013 01:02    ED Course  Procedures  Toradol 30 mg IM, patient states that she has had Toradol before and it will not help her pain. States the doctors usually give her Oxycodone 5 mg PO.  Oxycodone 5 mg PO given  MDM  43 y.o. female with vaginal bleeding and pelvic pain and UTI. I have reviewed this patient's vital signs, nurses notes, appropriate labs and imaging.  I have discussed findings and plan of care with the patient. I discussed for ovarian cyst that ibuprofen usually works better than a narcotic. She is to follow up in the [facility]. Stable for discharge with minimal bleeding and Hct of 14.2 and negative pregnancy.

## 2013-08-27 NOTE — MAU Note (Addendum)
Severe cramps for 3 days. Spotting for couple days but heavier bleeding today with clots. A lot of pelvic pressure like the bottom is going to fall out. Have had diarrhea today. Lower back pain. Denies n/v

## 2013-08-28 ENCOUNTER — Inpatient Hospital Stay (HOSPITAL_COMMUNITY): Payer: Medicare Other

## 2013-08-28 LAB — RPR

## 2013-08-28 LAB — WET PREP, GENITAL
Trich, Wet Prep: NONE SEEN
Yeast Wet Prep HPF POC: NONE SEEN

## 2013-08-28 LAB — HIV ANTIBODY (ROUTINE TESTING W REFLEX): HIV: NONREACTIVE

## 2013-08-28 MED ORDER — SULFAMETHOXAZOLE-TMP DS 800-160 MG PO TABS: 1.0000 | ORAL_TABLET | Freq: Two times a day (BID) | ORAL | Status: DC

## 2013-08-28 MED ORDER — OXYCODONE HCL 5 MG PO TABS
5.0000 mg | ORAL_TABLET | Freq: Once | ORAL | Status: AC
  Administered 2013-08-28: 5 mg via ORAL
  Filled 2013-08-28: qty 1

## 2013-08-28 MED ORDER — SULFAMETHOXAZOLE-TMP DS 800-160 MG PO TABS
1.0000 | ORAL_TABLET | Freq: Once | ORAL | Status: AC
  Administered 2013-08-28: 1 via ORAL
  Filled 2013-08-28: qty 1

## 2013-08-28 NOTE — Discharge Instructions (Signed)
Take the antibiotic as directed for your urinary tract infection. Call the [facility] to make a follow up appointment for your ovarian cyst.   Ovarian Cyst An ovarian cyst is a fluid-filled sac that forms on an ovary. The ovaries are small organs that produce eggs in women. Various types of cysts can form on the ovaries. Most are not cancerous. Many do not cause problems, and they often go away on their own. Some may cause symptoms and require treatment. Common types of ovarian cysts include:  Functional cysts--These cysts may occur every month during the menstrual cycle. This is normal. The cysts usually go away with the next menstrual cycle if the woman does not get pregnant. Usually, there are no symptoms with a functional cyst.  Endometrioma cysts--These cysts form from the tissue that lines the uterus. They are also called "chocolate cysts" because they become filled with blood that turns brown. This type of cyst can cause pain in the lower abdomen during intercourse and with your menstrual period.  Cystadenoma cysts--This type develops from the cells on the outside of the ovary. These cysts can get very big and cause lower abdomen pain and pain with intercourse. This type of cyst can twist on itself, cut off its blood supply, and cause severe pain. It can also easily rupture and cause a lot of pain.  Dermoid cysts--This type of cyst is sometimes found in both ovaries. These cysts may contain different kinds of body tissue, such as skin, teeth, hair, or cartilage. They usually do not cause symptoms unless they get very big.  Theca lutein cysts--These cysts occur when too much of a certain hormone (human chorionic gonadotropin) is produced and overstimulates the ovaries to produce an egg. This is most common after procedures used to assist with the conception of a baby (in vitro fertilization). CAUSES   Fertility drugs can cause a condition in which multiple large cysts are formed on the ovaries.  This is called ovarian hyperstimulation syndrome.  A condition called polycystic ovary syndrome can cause hormonal imbalances that can lead to nonfunctional ovarian cysts. SIGNS AND SYMPTOMS  Many ovarian cysts do not cause symptoms. If symptoms are present, they may include:  Pelvic pain or pressure.  Pain in the lower abdomen.  Pain during sexual intercourse.  Increasing girth (swelling) of the abdomen.  Abnormal menstrual periods.  Increasing pain with menstrual periods.  Stopping having menstrual periods without being pregnant. DIAGNOSIS  These cysts are commonly found during a routine or annual pelvic exam. Tests may be ordered to find out more about the cyst. These tests may include:  Ultrasound.  X-ray of the pelvis.  CT scan.  MRI.  Blood tests. TREATMENT  Many ovarian cysts go away on their own without treatment. Your health care provider may want to check your cyst regularly for 2-3 months to see if it changes. For women in menopause, it is particularly important to monitor a cyst closely because of the higher rate of ovarian cancer in menopausal women. When treatment is needed, it may include any of the following:  A procedure to drain the cyst (aspiration). This may be done using a long needle and ultrasound. It can also be done through a laparoscopic procedure. This involves using a thin, lighted tube with a tiny camera on the end (laparoscope) inserted through a small incision.  Surgery to remove the whole cyst. This may be done using laparoscopic surgery or an open surgery involving a larger incision in the lower  abdomen.  Hormone treatment or birth control pills. These methods are sometimes used to help dissolve a cyst. HOME CARE INSTRUCTIONS   Only take over-the-counter or prescription medicines as directed by your health care provider.  Follow up with your health care provider as directed.  Get regular pelvic exams and Pap tests. SEEK MEDICAL CARE IF:     Your periods are late, irregular, or painful, or they stop.  Your pelvic pain or abdominal pain does not go away.  Your abdomen becomes larger or swollen.  You have pressure on your bladder or trouble emptying your bladder completely.  You have pain during sexual intercourse.  You have feelings of fullness, pressure, or discomfort in your stomach.  You lose weight for no apparent reason.  You feel generally ill.  You become constipated.  You lose your appetite.  You develop acne.  You have an increase in body and facial hair.  You are gaining weight, without changing your exercise and eating habits.  You think you are pregnant. SEEK IMMEDIATE MEDICAL CARE IF:   You have increasing abdominal pain.  You feel sick to your stomach (nauseous), and you throw up (vomit).  You develop a fever that comes on suddenly.  You have abdominal pain during a bowel movement.  Your menstrual periods become heavier than usual. MAKE SURE YOU:  Understand these instructions.  Will watch your condition.  Will get help right away if you are not doing well or get worse. Document Released: 01/28/2005 Document Revised: 02/02/2013 Document Reviewed: 10/05/2012 [facility] Patient Information 2015 [place], [place]. This information is not intended to replace advice given to you by your health care provider. Make sure you discuss any questions you have with your health care provider.  Urinary Tract Infection A urinary tract infection (UTI) can occur any place along the urinary tract. The tract includes the kidneys, ureters, bladder, and urethra. A type of germ called bacteria often causes a UTI. UTIs are often helped with antibiotic medicine.  HOME CARE   If given, take antibiotics as told by your doctor. Finish them even if you start to feel better.  Drink enough fluids to keep your pee (urine) clear or pale yellow.  Avoid tea, drinks with caffeine, and bubbly (carbonated) drinks.  Pee  often. Avoid holding your pee in for a long time.  Pee before and after having sex (intercourse).  Wipe from front to back after you poop (bowel movement) if you are a woman. Use each tissue only once. GET HELP RIGHT AWAY IF:   You have back pain.  You have lower belly (abdominal) pain.  You have chills.  You feel sick to your stomach (nauseous).  You throw up (vomit).  Your burning or discomfort with peeing does not go away.  You have a fever.  Your symptoms are not better in 3 days. MAKE SURE YOU:   Understand these instructions.  Will watch your condition.  Will get help right away if you are not doing well or get worse. Document Released: 07/17/2007 Document Revised: 10/23/2011 Document Reviewed: 08/29/2011 [facility] Patient Information 2015 [place], [place]. This information is not intended to replace advice given to you by your health care provider. Make sure you discuss any questions you have with your health care provider.

## 2013-08-28 NOTE — Progress Notes (Signed)
Written and verbal d/c instructions given and understanding voiced. 

## 2013-08-30 LAB — GC/CHLAMYDIA PROBE AMP
CT Probe RNA: NEGATIVE
GC Probe RNA: POSITIVE — AB

## 2013-08-30 NOTE — MAU Provider Note (Signed)
Attestation of Attending Supervision of Advanced Practitioner: Evaluation and management procedures were performed by the PA/NP/CNM/OB Fellow under my supervision/collaboration. Chart reviewed and agree with management and plan.  Makaiyah Schweiger V 08/30/2013 6:29 AM

## 2013-09-01 ENCOUNTER — Telehealth (HOSPITAL_COMMUNITY): Payer: Self-pay | Admitting: Nurse Practitioner

## 2013-09-01 NOTE — Telephone Encounter (Signed)
Telephone call to patient regarding positive GC culture, patient not in, left message for her to call.  Patient has not been treated and will need referral for treatment.  Report faxed to health department.

## 2013-09-01 NOTE — Telephone Encounter (Signed)
Patient returned call, notified her of positive GC culture.  Instructed patient to schedule treatment with either [facility] or [facility] clinics.  Patient is currently out of town and will schedule treatment at her earliest convenience.  Instructed patient to notify her partner for treatment.

## 2013-11-19 ENCOUNTER — Encounter (HOSPITAL_COMMUNITY): Payer: Self-pay | Admitting: Emergency Medicine

## 2013-11-19 ENCOUNTER — Emergency Department (HOSPITAL_COMMUNITY)
Admission: EM | Admit: 2013-11-19 | Discharge: 2013-11-19 | Disposition: A | Discharge status: Home / Self Care | Payer: Medicare Other | Attending: Emergency Medicine | Admitting: Emergency Medicine

## 2013-11-19 ENCOUNTER — Emergency Department (HOSPITAL_COMMUNITY): Payer: Medicare Other

## 2013-11-19 DIAGNOSIS — Z8739 Personal history of other diseases of the musculoskeletal system and connective tissue: Secondary | ICD-10-CM | POA: Diagnosis not present

## 2013-11-19 DIAGNOSIS — Z8619 Personal history of other infectious and parasitic diseases: Secondary | ICD-10-CM | POA: Diagnosis not present

## 2013-11-19 DIAGNOSIS — H538 Other visual disturbances: Secondary | ICD-10-CM | POA: Diagnosis not present

## 2013-11-19 DIAGNOSIS — R1084 Generalized abdominal pain: Secondary | ICD-10-CM | POA: Insufficient documentation

## 2013-11-19 DIAGNOSIS — Z79899 Other long term (current) drug therapy: Secondary | ICD-10-CM | POA: Insufficient documentation

## 2013-11-19 DIAGNOSIS — N938 Other specified abnormal uterine and vaginal bleeding: Secondary | ICD-10-CM | POA: Diagnosis present

## 2013-11-19 DIAGNOSIS — Z720 Tobacco use: Secondary | ICD-10-CM | POA: Insufficient documentation

## 2013-11-19 DIAGNOSIS — J441 Chronic obstructive pulmonary disease with (acute) exacerbation: Secondary | ICD-10-CM | POA: Insufficient documentation

## 2013-11-19 DIAGNOSIS — Z3202 Encounter for pregnancy test, result negative: Secondary | ICD-10-CM | POA: Diagnosis not present

## 2013-11-19 DIAGNOSIS — N939 Abnormal uterine and vaginal bleeding, unspecified: Secondary | ICD-10-CM | POA: Insufficient documentation

## 2013-11-19 DIAGNOSIS — J40 Bronchitis, not specified as acute or chronic: Secondary | ICD-10-CM

## 2013-11-19 DIAGNOSIS — G8929 Other chronic pain: Secondary | ICD-10-CM | POA: Diagnosis not present

## 2013-11-19 DIAGNOSIS — Z880 Allergy status to penicillin: Secondary | ICD-10-CM | POA: Diagnosis not present

## 2013-11-19 DIAGNOSIS — R51 Headache: Secondary | ICD-10-CM | POA: Diagnosis not present

## 2013-11-19 DIAGNOSIS — R197 Diarrhea, unspecified: Secondary | ICD-10-CM | POA: Insufficient documentation

## 2013-11-19 DIAGNOSIS — R112 Nausea with vomiting, unspecified: Secondary | ICD-10-CM | POA: Diagnosis not present

## 2013-11-19 DIAGNOSIS — F419 Anxiety disorder, unspecified: Secondary | ICD-10-CM | POA: Insufficient documentation

## 2013-11-19 DIAGNOSIS — R109 Unspecified abdominal pain: Secondary | ICD-10-CM

## 2013-11-19 LAB — URINALYSIS, ROUTINE W REFLEX MICROSCOPIC
BILIRUBIN URINE: NEGATIVE
GLUCOSE, UA: NEGATIVE mg/dL
Ketones, ur: NEGATIVE mg/dL
Leukocytes, UA: NEGATIVE
Nitrite: NEGATIVE
PH: 6 (ref 5.0–8.0)
Protein, ur: NEGATIVE mg/dL
SPECIFIC GRAVITY, URINE: 1.009 (ref 1.005–1.030)
Urobilinogen, UA: 0.2 mg/dL (ref 0.0–1.0)

## 2013-11-19 LAB — COMPREHENSIVE METABOLIC PANEL
ALT: 17 U/L (ref 0–35)
AST: 22 U/L (ref 0–37)
Albumin: 3.8 g/dL (ref 3.5–5.2)
Alkaline Phosphatase: 64 U/L (ref 39–117)
Anion gap: 14 (ref 5–15)
BILIRUBIN TOTAL: 0.3 mg/dL (ref 0.3–1.2)
BUN: 10 mg/dL (ref 6–23)
CHLORIDE: 104 meq/L (ref 96–112)
CO2: 25 mEq/L (ref 19–32)
CREATININE: 0.72 mg/dL (ref 0.50–1.10)
Calcium: 9.7 mg/dL (ref 8.4–10.5)
GFR calc non Af Amer: >90 mL/min (ref >90)
Glucose, Bld: 79 mg/dL (ref 70–99)
Potassium: 4.3 mEq/L (ref 3.7–5.3)
SODIUM: 143 meq/L (ref 137–147)
Total Protein: 6.7 g/dL (ref 6.0–8.3)

## 2013-11-19 LAB — CBC WITH DIFFERENTIAL/PLATELET
BASOS ABS: 0.1 10*3/uL (ref 0.0–0.1)
Basophils Relative: 1 % (ref 0–1)
EOS PCT: 2 % (ref 0–5)
Eosinophils Absolute: 0.2 10*3/uL (ref 0.0–0.7)
HEMATOCRIT: 44.6 % (ref 36.0–46.0)
Hemoglobin: 14.7 g/dL (ref 12.0–15.0)
LYMPHS PCT: 32 % (ref 12–46)
Lymphs Abs: 2.4 10*3/uL (ref 0.7–4.0)
MCH: 32.4 pg (ref 26.0–34.0)
MCHC: 33 g/dL (ref 30.0–36.0)
MCV: 98.2 fL (ref 78.0–100.0)
MONO ABS: 0.3 10*3/uL (ref 0.1–1.0)
Monocytes Relative: 5 % (ref 3–12)
NEUTROS ABS: 4.6 10*3/uL (ref 1.7–7.7)
Neutrophils Relative %: 60 % (ref 43–77)
Platelets: 220 10*3/uL (ref 150–400)
RBC: 4.54 MIL/uL (ref 3.87–5.11)
RDW: 13.5 % (ref 11.5–15.5)
WBC: 7.6 10*3/uL (ref 4.0–10.5)

## 2013-11-19 LAB — URINE MICROSCOPIC-ADD ON

## 2013-11-19 LAB — PREGNANCY, URINE: Preg Test, Ur: NEGATIVE

## 2013-11-19 LAB — LIPASE, BLOOD: Lipase: 24 U/L (ref 11–59)

## 2013-11-19 MED ORDER — SODIUM CHLORIDE 0.9 % IV BOLUS (SEPSIS)
1000.0000 mL | Freq: Once | INTRAVENOUS | Status: AC
  Administered 2013-11-19: 1000 mL via INTRAVENOUS

## 2013-11-19 MED ORDER — OXYCODONE HCL 5 MG PO TABS
5.0000 mg | ORAL_TABLET | Freq: Once | ORAL | Status: AC
  Administered 2013-11-19: 5 mg via ORAL
  Filled 2013-11-19: qty 1

## 2013-11-19 MED ORDER — ALBUTEROL SULFATE HFA 108 (90 BASE) MCG/ACT IN AERS
2.0000 | INHALATION_SPRAY | Freq: Once | RESPIRATORY_TRACT | Status: AC
  Administered 2013-11-19: 2 via RESPIRATORY_TRACT
  Filled 2013-11-19: qty 6.7

## 2013-11-19 MED ORDER — ONDANSETRON HCL 4 MG/2ML IJ SOLN
4.0000 mg | Freq: Once | INTRAMUSCULAR | Status: AC
  Administered 2013-11-19: 4 mg via INTRAVENOUS
  Filled 2013-11-19: qty 2

## 2013-11-19 MED ORDER — HYDROMORPHONE HCL 1 MG/ML IJ SOLN
1.0000 mg | Freq: Once | INTRAMUSCULAR | Status: AC
  Administered 2013-11-19: 1 mg via INTRAVENOUS
  Filled 2013-11-19: qty 1

## 2013-11-19 NOTE — ED Provider Notes (Signed)
CSN: [id number]     Arrival date & time 11/19/13  1435 History   First MD Initiated Contact with Patient 11/19/13 1656     Chief Complaint  Patient presents with  . Vaginal Bleeding     (Consider location/radiation/quality/duration/timing/severity/associated sxs/prior Treatment) HPI  Patient with hx Hepatitis B and C, COPD presents with multiple chronic complaints.   1 Her primary complaint is right sided abdominal pain.  States she has right upper abdominal and right flank pain.  Pain is 10/10, worse with sitting, better with stabbing.  Pain is sharp, stabbing.  States this feels exactly like her "liver acting up," which occurs every few months.  Took ibuprofen without relief.  Has chronic N/V/D and fevers whenever this occurs, has had the same over the past few days.  Emesis is yellow.  No blood.  Stools are loose, brown, 2-3/day.  No blood.  This has been occuring every few months since she was diagnosed with hepatitis as a teenager.   2.  Has had vaginal bleeding on and off for 3 months.  She currently has only light vaginal spotting.  Has been seen for this at [facility] and was diagnosed and treated for gonorrhea, no other treatment.  States she has spotting for 10-12 days, a few days without bleeding, 5 days of heavy bleeding, and repeats.  Last heavy bleeding was 2 weeks ago.  She is not currently sexually active and has not been since she was treated for GC.  Denies abnormal vaginal discharge otherwise, or any itching or burning.  Denies urinary symptoms.    3. Pt also has COPD and smokes (20 pack year hx).  Has had mild worsening of her chronic SOB x 3 days.  Unchanged cough, unchanged sputum.  Denies CP.         Past Medical History  Diagnosis Date  . DDD (degenerative disc disease)   . Acute hepatitis B   . Hepatitis C, acute   . COPD (chronic obstructive pulmonary disease)     July 2006   . Anxiety     treated with xanax   Past Surgical History  Procedure  Laterality Date  . Breast surgery      1996 breast augmentation   Family History  Problem Relation Age of Onset  . Cancer Mother   . Cancer Sister    History  Substance Use Topics  . Smoking status: Current Every Day Smoker -- 0.50 packs/day for 26 years  . Smokeless tobacco: Never Used  . Alcohol Use: No   OB History   Grav Para Term Preterm Abortions TAB SAB Ect Mult Living   5 2 2  3 2 1   2      Review of Systems  Eyes: Visual disturbance: gradually worsening blurriness.  No diplopia.  Neurological: Positive for headaches (chronic intermittent x 6 months).  All other systems reviewed and are negative.     Allergies  Codeine; Penicillins; Acetaminophen; and Crab  Home Medications   Prior to Admission medications   Medication Sig Start Date End Date Taking? Authorizing Provider  ALPRAZolam Duanne Moron) 1 MG tablet Take 1 mg by mouth 4 (four) times daily as needed. For anxiety or sleep   Yes Historical Provider, MD  amphetamine-dextroamphetamine (ADDERALL XR) 20 MG 24 hr capsule Take 20 mg by mouth daily.   Yes Historical Provider, MD   BP 124/88  Pulse 86  Temp(Src) 98.1 F (36.7 C) (Oral)  Resp 16  SpO2 100%  LMP 11/10/2013  Breastfeeding? No Physical Exam  Nursing note and vitals reviewed. Constitutional: She appears well-developed and well-nourished. No distress.  HENT:  Head: Normocephalic and atraumatic.  Neck: Neck supple.  Cardiovascular: Normal rate and regular rhythm.   Pulmonary/Chest: Effort normal and breath sounds normal. No respiratory distress. She has no wheezes. She has no rales.  Coarse breath sounds  Abdominal: Soft. She exhibits no distension. There is tenderness. There is no rebound and no guarding.    Neurological: She is alert.  Skin: She is not diaphoretic.    ED Course  Procedures (including critical care time) Labs Review Labs Reviewed  URINALYSIS, ROUTINE W REFLEX MICROSCOPIC - Abnormal; Notable for the following:    APPearance  CLOUDY (*)    Hgb urine dipstick MODERATE (*)    All other components within normal limits  URINE MICROSCOPIC-ADD ON - Abnormal; Notable for the following:    Squamous Epithelial / LPF FEW (*)    All other components within normal limits  PREGNANCY, URINE  CBC WITH DIFFERENTIAL  COMPREHENSIVE METABOLIC PANEL  LIPASE, BLOOD    Imaging Review Dg Chest 2 View  11/19/2013   CLINICAL DATA:  Smoker with current history of COPD, presenting with an exacerbation, cough and shortness of breath. Current history of hepatitis C.  EXAM: CHEST  2 VIEW  COMPARISON:  Two-view chest x-ray 04/14/2011, 02/21/2009, 12/10/2008. Prior imaging at [facility] and [facility]  FINDINGS: Cardiomediastinal silhouette unremarkable, unchanged. Emphysematous changes in the upper lobes and mild hyperinflation, unchanged. Moderate central peribronchial thickening, more so than on the prior examinations. Lungs otherwise clear. No localized airspace consolidation. No pleural effusions. No pneumothorax. Normal pulmonary vascularity. Degenerative changes throughout the thoracic spine.  IMPRESSION: Acute bronchitis superimposed upon COPD/emphysema. No evidence of pneumonia.   Electronically Signed   By: Evangeline Dakin M.D.   On: 11/19/2013 18:26     EKG Interpretation None      Pt declines breathing treatment.  Her main concern today is her abdominal pain.   8:16 PM Pt feeling much better after pain medication.  She needs primary care and gyn to follow up with her on her chronic complaints.  Will give resources.  Her labs are unremarkable, urine unremarkable.  As her chronic abnormal vaginal bleeding has been ongoing for 3 months, has been checked before, and is not currently sexually active or having new complaints.  Currently symptoms are very mild.  Will need gyn follow up.  Hgb is normal.  She has no lower abdominal pain.  I do not think we need to do a pelvic as part of the workup today.   MDM   Final diagnoses:   Chronic abdominal pain  Abnormal vaginal bleeding  Bronchitis   Afebrile, nontoxic patient with multiple chronic complaints that have been ongoing for months-years. Workup unremarkable.   D/C home with PCP, gyn follow up. Narcotics not given as these are chronic problems pt has had for a long time - discussed need for primary care provider to manage chronic pain.  Resources given for follow up.  CXR noted to have acute bronchitis on CXR - pt notes mild worsening of chronic symptoms but declines neb treatments in ED, lungs CTAB.   Discussed result, findings, treatment, and follow up  with patient.  Pt given return precautions.  Pt verbalizes understanding and agrees with plan.        Clayton Bibles, PA-C 11/19/13 2349

## 2013-11-19 NOTE — Discharge Instructions (Signed)
Read the information below.  You may return to the Emergency Department at any time for worsening condition or any new symptoms that concern you.If you develop high fevers, worsening abdominal pain, uncontrolled vomiting, or are unable to tolerate fluids by mouth, return to the ER for a recheck.  If you develop chest pain, worsening shortness of breath, fever, you pass out, or become weak or dizzy, return to the ER for a recheck.      Abdominal Pain Many things can cause belly (abdominal) pain. Most times, the belly pain is not dangerous. Many cases of belly pain can be watched and treated at home. HOME CARE   Do not take medicines that help you go poop (laxatives) unless told to by your doctor.  Only take medicine as told by your doctor.  Eat or drink as told by your doctor. Your doctor will tell you if you should be on a special diet. GET HELP IF:  You do not know what is causing your belly pain.  You have belly pain while you are sick to your stomach (nauseous) or have runny poop (diarrhea).  You have pain while you pee or poop.  Your belly pain wakes you up at night.  You have belly pain that gets worse or better when you eat.  You have belly pain that gets worse when you eat fatty foods.  You have a fever. GET HELP RIGHT AWAY IF:   The pain does not go away within 2 hours.  You keep throwing up (vomiting).  The pain changes and is only in the right or left part of the belly.  You have bloody or tarry looking poop. MAKE SURE YOU:   Understand these instructions.  Will watch your condition.  Will get help right away if you are not doing well or get worse. Document Released: 07/17/2007 Document Revised: 02/02/2013 Document Reviewed: 10/07/2012 [facility] Patient Information 2015 [place], [place]. This information is not intended to replace advice given to you by your health care provider. Make sure you discuss any questions you have with your health care  provider.    Emergency Department Resource Guide 1) Find a Doctor and Pay Out of Pocket Although you won't have to find out who is covered by your insurance plan, it is a good idea to ask around and get recommendations. You will then need to call the office and see if the doctor you have chosen will accept you as a new patient and what types of options they offer for patients who are self-pay. Some doctors offer discounts or will set up payment plans for their patients who do not have insurance, but you will need to ask so you aren't surprised when you get to your appointment.  2) Contact Your Local Health Department Not all health departments have doctors that can see patients for sick visits, but many do, so it is worth a call to see if yours does. If you don't know where your local health department is, you can check in your phone book. The CDC also has a tool to help you locate your state's health department, and many state websites also have listings of all of their local health departments.  3) Find a [facility] If your illness is not likely to be very severe or complicated, you may want to try a walk in clinic. These are popping up all over the country in pharmacies, drugstores, and shopping centers. They're usually staffed by nurse practitioners or physician assistants that  have been trained to treat common illnesses and complaints. They're usually fairly quick and inexpensive. However, if you have serious medical issues or chronic medical problems, these are probably not your best option.  No Primary Care Doctor: - Call Health Connect at  [phone] - they can help you locate a primary care doctor that  accepts your insurance, provides certain services, etc. - Physician Referral Service- [phone]  Chronic Pain Problems: Organization         Address  Phone   Notes  [facility]  [phone] Patients need to be referred by their primary care doctor.    Medication Assistance: Organization         Address  Phone   Notes  [facility] Medication [facility] [address] [place], [place] [address] [phone] --Must be a resident of [facility] -- Must have NO insurance coverage whatsoever (no Medicaid/ Medicare, etc.) -- The pt. MUST have a primary care doctor that directs their care regularly and follows them in the community   MedAssist  [phone]   [employer]  [phone]    Agencies that provide inexpensive medical care: Organization         Address  Phone   Notes  [place]  [phone]   [facility]    [phone]   [facility] [place], [place] [address] [phone]   [place] [address], [place] [phone]   Planned Parenthood    [phone]   [facility]    [phone]   [place] [address], [place] Phone:  [phone], Fax:  [phone] Hours of Operation:  9 am - 6 pm, M-F.  Also accepts Medicaid/Medicare and self-pay.  [facility] for [place], [address], [place] Phone: [phone], Fax: [phone]. Hours of Operation:  8:30 am - 5:30 pm, M-F.  Also accepts Medicaid and self-pay.  [facility] [place] [address], [place] Phone: [phone]   [place], [place], [place] [phone], Ext. [phone] Mondays & Thursdays: 7-9 AM.  First 15 patients are seen on a first come, first serve basis.    [place] Providers:  Organization         Address  Phone   Notes  [facility] [address], [place] [phone] Also accepts self-pay patients.  [facility] [address], [place]  [phone]   [place], [address], [place] [phone]   [facility] [address] [place] [phone]   Lucianne Lei [address], [place]   [phone] Only accepts [place] Access [place] patients after they have their name applied to their card.   Self-Pay (no insurance) in [facility]:  Organization         Address  Phone   Notes  Sickle Cell Patients, [facility] [place] [phone]   [facility] [place] [phone]   [facility]gent [place]  [place], [address], [place] [phone]   [facility]/Dr. Osei-Bonsu  [address]  or [place], [address], [place] [phone] Phone number for both [facility] and [place] locations is the same.  Urgent Medical and [facility] [address], [place] [phone]   [facility] [address], [place] or [address] [phone]   [facility] [address] [place] [phone], phone; [phone], fax Sees patients 1st and 3rd Saturday of every month.  Must not qualify for public or private insurance (i.e. Medicaid, Medicare, [place] Health Choice, Veterans' Benefits)  Household income should be no more than 200% of the poverty level The clinic cannot treat you if you are pregnant or think you are pregnant  Sexually transmitted diseases are not treated at the clinic.    Dental Care: Organization         Address  Phone  Notes  [facility] Department of [facility] [phone] Accepts children up to age 16 who are enrolled in [place] or [place]; pregnant women with a Medicaid card; and children who have applied for Medicaid or [place] Health Choice, but were declined, whose parents can pay a reduced fee at time of service.   [facility] Department of [facility]  [address], [place] [phone] Accepts children up to age 16 who are enrolled in [place] or [place]; pregnant women with a Medicaid card; and children who have applied for Medicaid or [place] Health Choice, but were declined, whose parents can pay a reduced fee at time of service.  [place] Adult Dental Access PROGRAM  [place] [phone] Patients are seen by appointment only. Walk-ins are not accepted. [place] will see patients 24 years of age and older. Monday - Tuesday (8am-5pm) Most Wednesdays (8:30-5pm) $30 per visit, cash only  [facility] Adult Dental Access PROGRAM  [address], [facility] [phone] Patients are seen by appointment only. Walk-ins are not accepted. [place] will see patients 68 years of age and older. One Wednesday Evening (Monthly: Volunteer Based).  $30 per visit, cash only  [place]  [phone] for adults; Children under age 65, call Graduate Pediatric Dentistry at [phone]. Children aged 7-14, please call [phone] to request a pediatric application.  Dental services are provided in all areas of dental care including fillings, crowns and bridges, complete and partial dentures, implants, gum treatment, root canals, and extractions. Preventive care is also provided. Treatment is provided to both adults and children. Patients are selected via a lottery and there is often a waiting list.   [facility] [address], [place]  [phone] [email or web address]   [facility] [address] [place], [place] [phone], Ext. [phone] Second and Fourth Thursday of each month, opens at 6:30 AM; Clinic ends at 9 AM.  Patients are seen on a first-come first-served basis, and a limited number are seen during each clinic.   [facility]  [address] [place], [place] [phone]   Eligibility Requirements You must have lived in [place], [place], or [place] counties for at least the last three months.   You cannot be eligible for state or federal sponsored [employer], including [employer], [place], or [employer].   You generally cannot be eligible for healthcare insurance through your employer.    How to apply: Eligibility  screenings are held every Tuesday and Wednesday afternoon from 1:00 pm until 4:00 pm. You do not need an appointment for the interview!  [facility] [address], [place], [place]   [place]  [place] Department  [place]  [phone]    Behavioral Health Resources in the Community: Intensive Outpatient Programs Organization         Address  Phone  Notes  [facility] [place]. [address] [place], [place] [phone]   [facility] Outpatient [address], [place], [place]   ADS: Alcohol [facility] Drug Svcs [address], [place], [place]   [place] [address],  [place], [place] or [phone]   Substance Abuse Resources Organization         Address  Phone  Notes  Alcohol and Drug Services  [phone]   [place]  [phone]   [facility] [place]   Chinita Pester  [phone]   Residential & Outpatient Substance Abuse Program  [phone]   Psychological Services Organization         Address  Phone  Notes  [facility]  [place]  [phone]   [place] [address], [place] or [phone]    Mobile Crisis Teams Organization         Address  Phone  Notes  [facility], Mobile Crisis Care Unit  [phone]   [facility]  [address] [place], [place]   [place] [address] [place] [phone]    Self-Help/Support Groups Organization         Address  Phone             Notes  [place]. of [place] - variety of support groups  [place] Call for more information  Narcotics Anonymous (NA), [facility] [address], [employer] [place]  2 meetings at this location   Special educational needs teacher         Address  Phone  Notes  [facility]idential [facility] [place],    [place]  [phone]   [facility]  [address] [place] [address]14481, [place], [place]   [place], [place] [phone] Admissions: 8am-3pm M-F  Incentives Substance [place] [address],    [place], [place] [phone]   [facility] [address] [place], [place], [place]   [facility] [address]  [place], [place]   [facility] - Intensive Outpatient [address], [place], [place]   [facility]  [place], [place] [phone] or [phone]   [facility] (RTS) [address], [place], [place] Accepts Medicaid  [facility] [place] [address],  [place] [phone] Substance Abuse/Addiction Treatment   [facility] Organization         Address  Phone  Notes  [facility]  [phone]   Domenic Schwab, PhD [address] [place], [place]   [phone] or [phone]   [place], [place] [phone]   [facility] [place] [address]  [place], [place] [phone]  Insurance/Medicaid/sponsorship through [employer] and Families [address] [place], [place] [phone]  [place], [place] [phone]    Dr. Adele Schilder  [phone]   [facility] Dept. 1) [address] [place] 2) [place] 3)  [place] [address], [place] [phone]  [phone]   [place] [phone] or [phone] (After Hours)

## 2013-11-19 NOTE — ED Notes (Signed)
Pt from home vaginal bleeding off and on for 3 months. Pt sts that she is bleeding heavily most days going through 6-7 pads. Pt also reports R side pain that radiates to back. Pt sts that she has hx of Hep B and C and has liver problems, has blurred vision and SOB. Pt is A&O and in NAD

## 2013-11-19 NOTE — ED Notes (Signed)
Went to collect labs - pt wants to wait for IV.  RN aware.

## 2013-11-20 NOTE — ED Provider Notes (Signed)
Medical screening examination/treatment/procedure(s) were performed by non-physician practitioner and as supervising physician I was immediately available for consultation/collaboration.   EKG Interpretation None        Wandra Arthurs, MD 11/20/13 1555

## 2013-12-13 ENCOUNTER — Encounter (HOSPITAL_COMMUNITY): Payer: Self-pay | Admitting: Emergency Medicine

## 2014-12-30 IMAGING — US US ABDOMEN COMPLETE
1 series · 14 of 25 positions shown · non-contrast
Comparison: 10/01/2010.

CLINICAL DATA: Right upper quadrant pain.  Emesis.

EXAM:
ULTRASOUND ABDOMEN COMPLETE

[Series 1: us abdomen complete · 0.20mm/px · 14 of 65 slices shown]
[im 1/65]
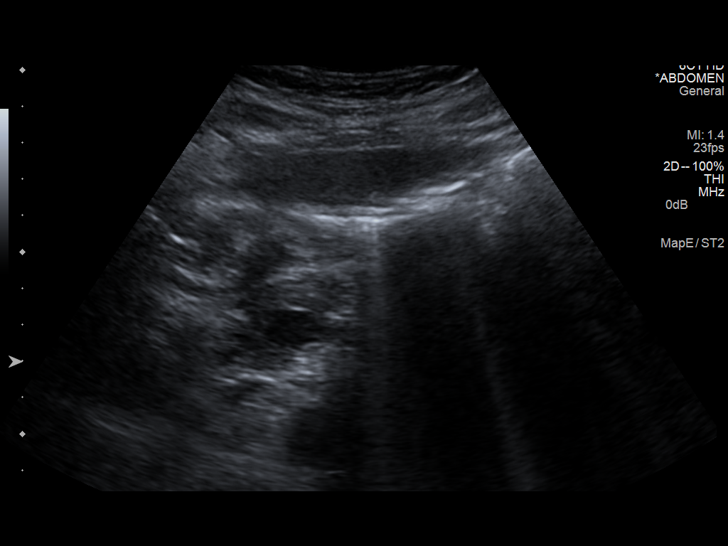
[im 6/65]
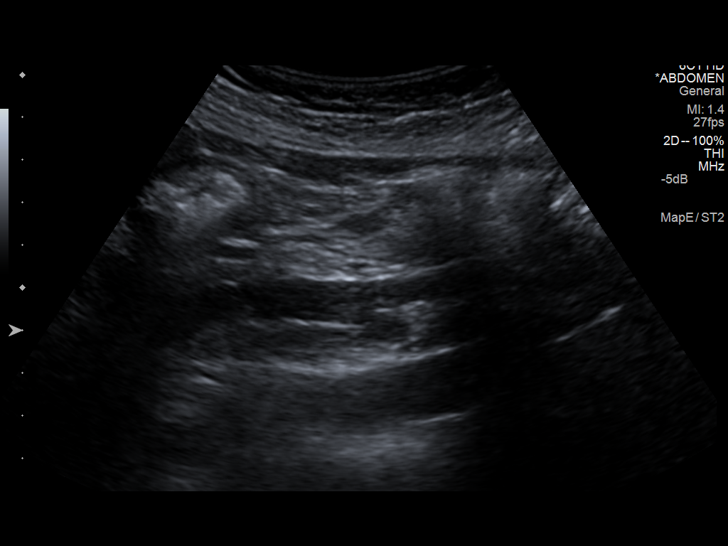
[im 11/65]
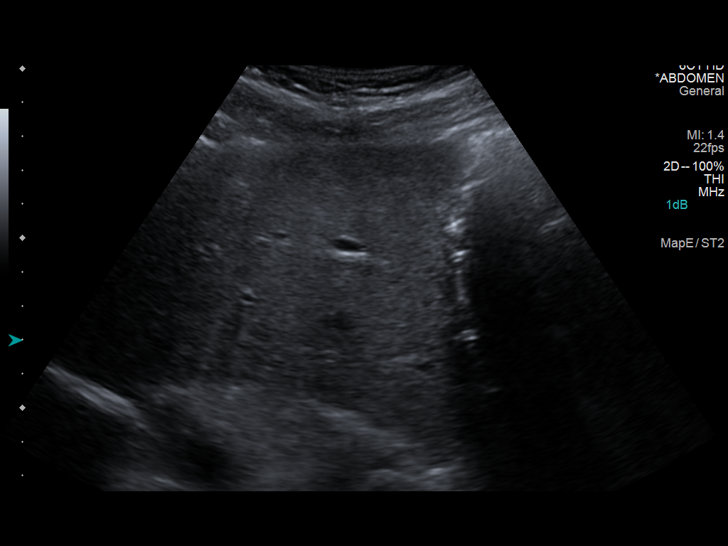
[im 17/65]
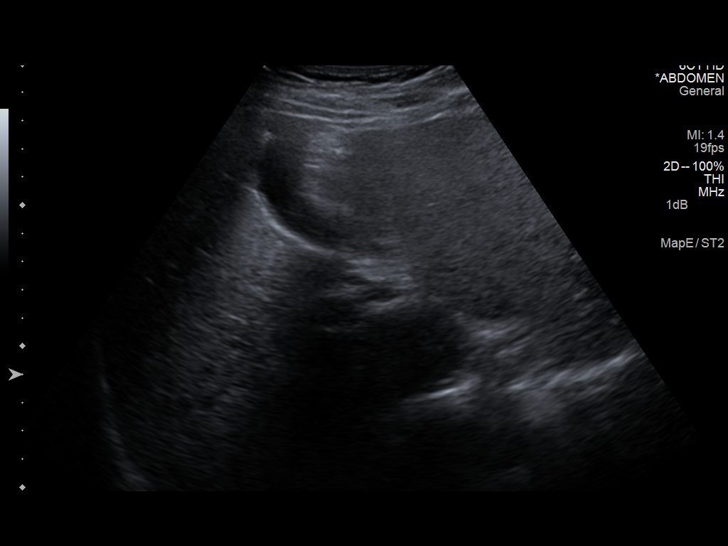
[im 22/65]
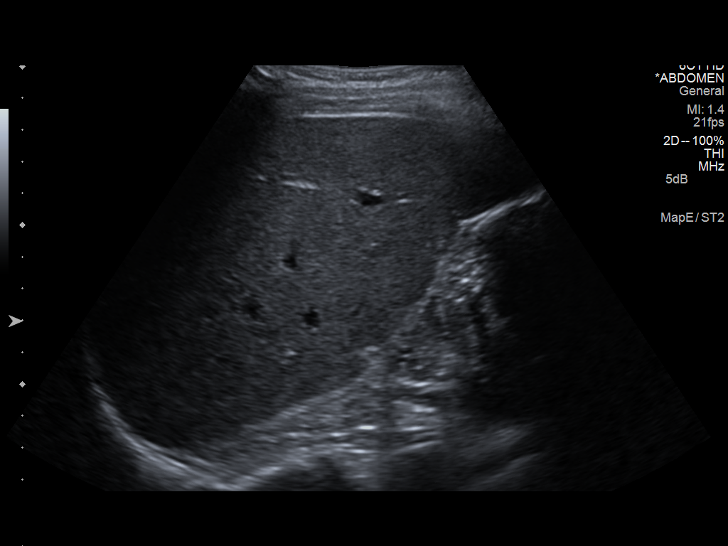
[im 25/65]
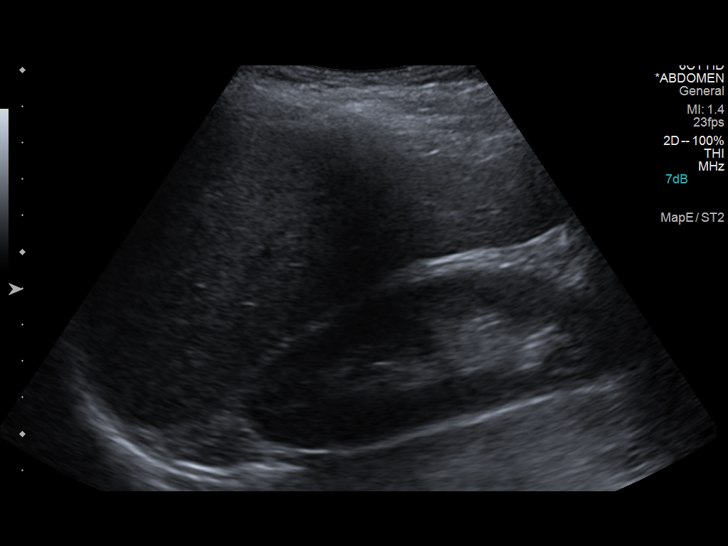
[im 30/65]
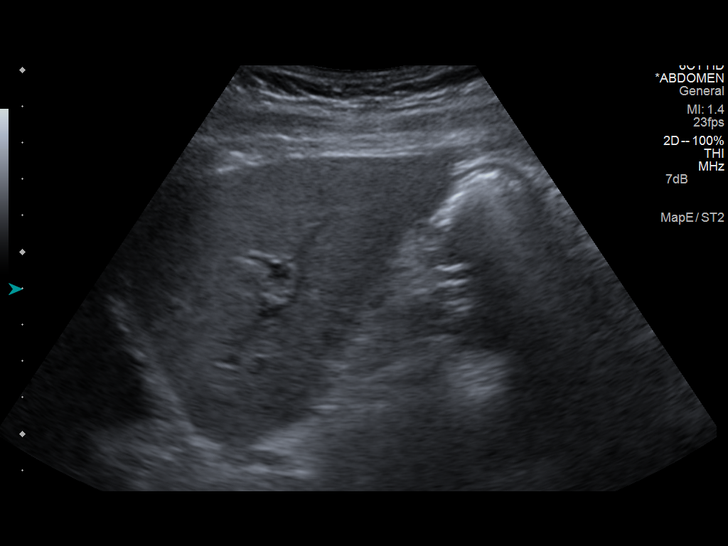
[im 35/65]
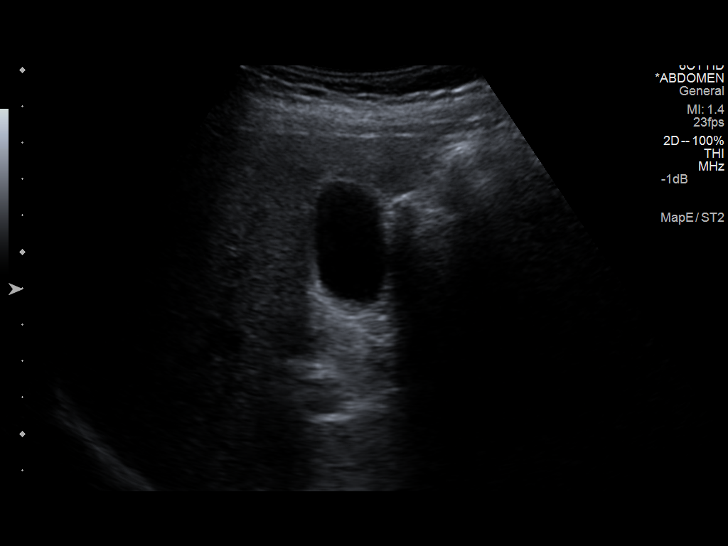
[im 41/65]
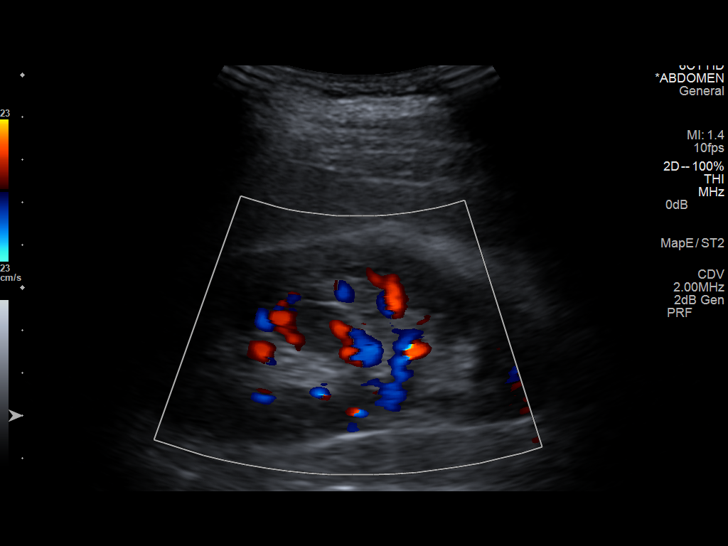
[im 43/65]
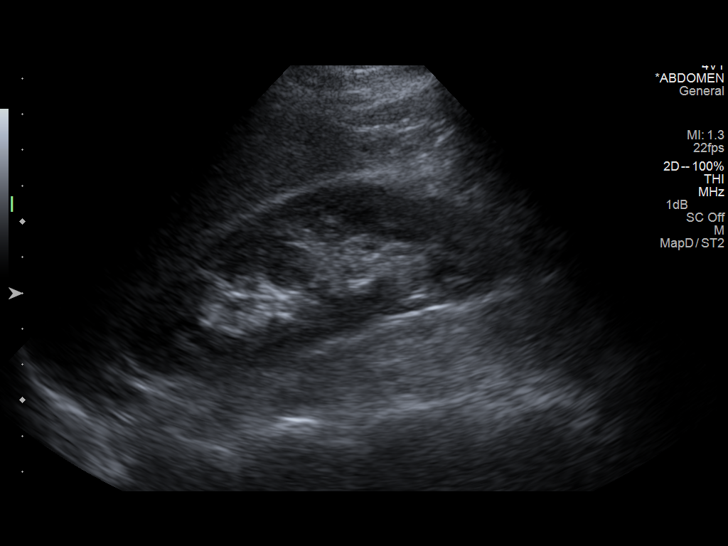
[im 49/65]
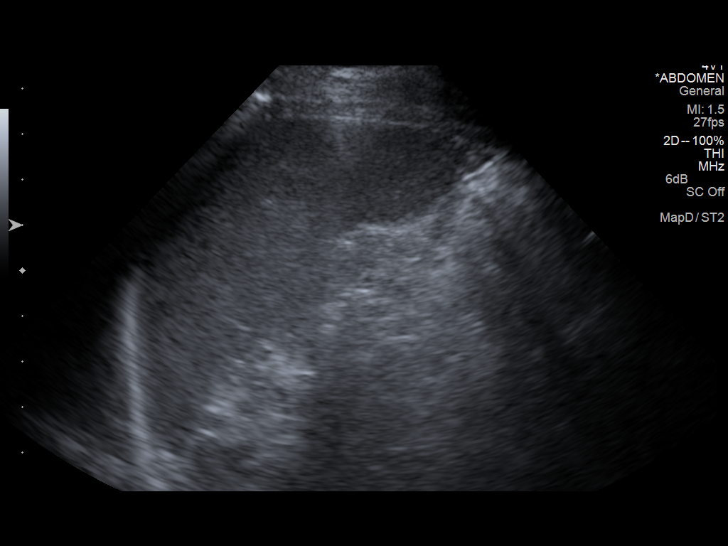
[im 54/65]
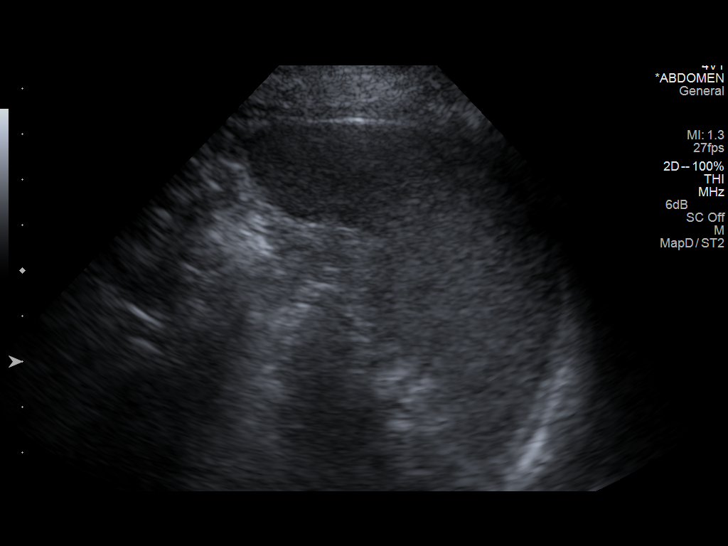
[im 59/65]
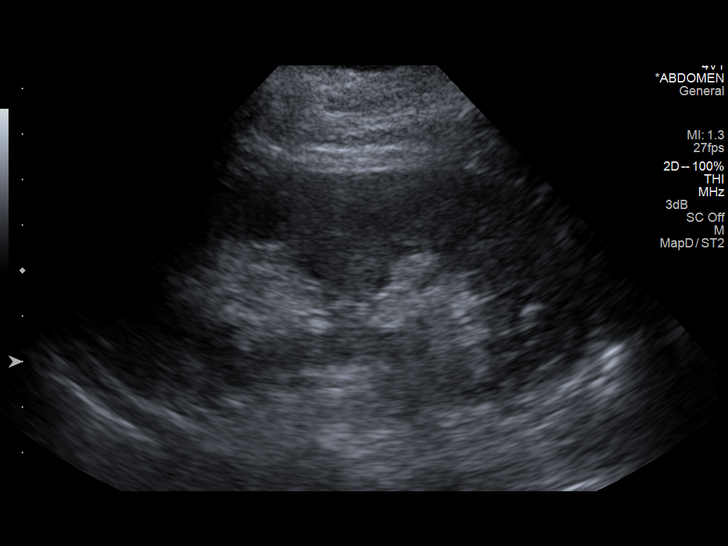
[im 65/65]
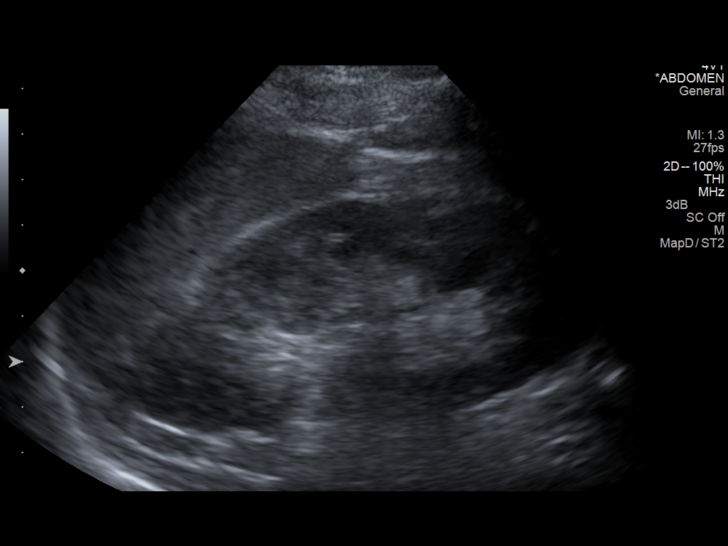

[14 of 25 positions shown; findings below may reference images not displayed]

FINDINGS: Gallbladder:

No gallstones or wall thickening visualized. No sonographic Murphy
sign noted.

Common bile duct:

Diameter: 3 mm, normal.

Liver:

No focal lesion identified. Within normal limits in parenchymal
echogenicity.

IVC:

No abnormality visualized.

Pancreas:

Visualized portion unremarkable.

Spleen:

7 cm.  Normal echotexture.

Right Kidney:

Length: 11.2 cm. Echogenicity within normal limits. No mass or
hydronephrosis visualized.

Left Kidney:

Length: 10.7 cm. Echogenicity within normal limits. No mass or
hydronephrosis visualized.

Abdominal aorta:

No aneurysm visualized.

Other findings:

None.
IMPRESSION: Normal abdominal ultrasound. Negative for cholelithiasis or
cholecystitis.

## 2015-02-01 IMAGING — US US PELVIS COMPLETE
1 series · 14 of 25 positions shown · non-contrast
Comparison: None

CLINICAL DATA: Pelvic pain, vaginal bleeding

EXAM:
TRANSABDOMINAL AND TRANSVAGINAL ULTRASOUND OF PELVIS
TECHNIQUE: Both transabdominal and transvaginal ultrasound examinations of the
pelvis were performed. Transabdominal technique was performed for
global imaging of the pelvis including uterus, ovaries, adnexal
regions, and pelvic cul-de-sac. It was necessary to proceed with
endovaginal exam following the transabdominal exam to visualize the
endometrium and ovaries.

[Series 1: us pelvis complete · 14 of 70 slices shown]
[im 1/70]
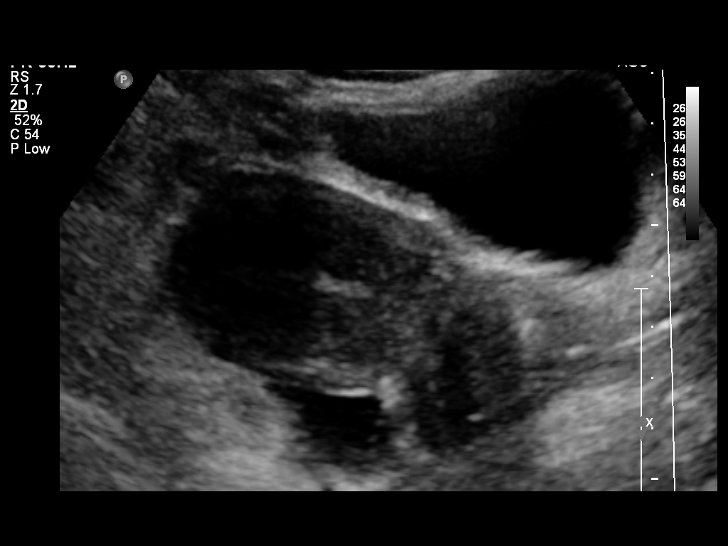
[im 6/70]
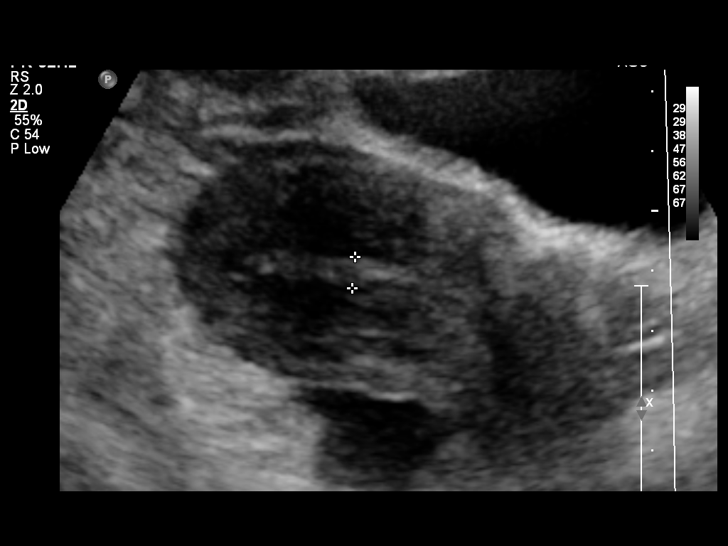
[im 12/70]
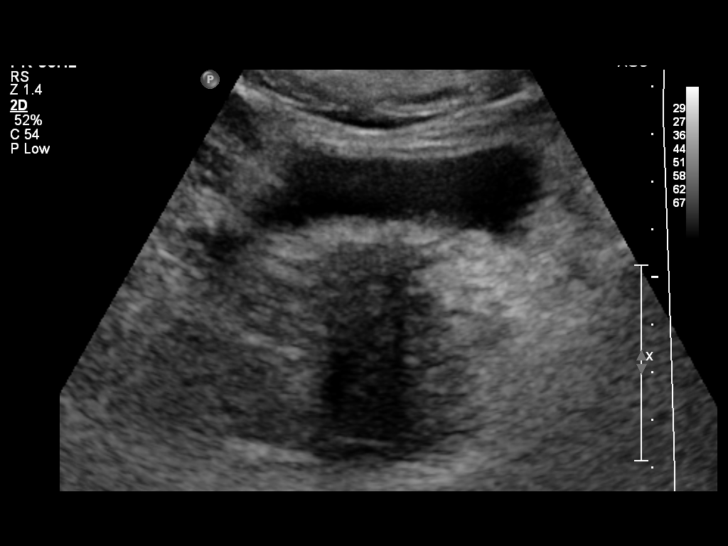
[im 18/70]
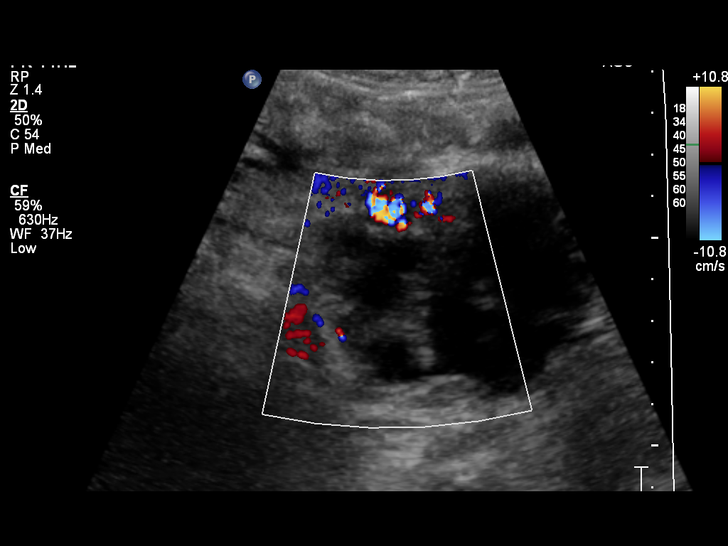
[im 24/70]
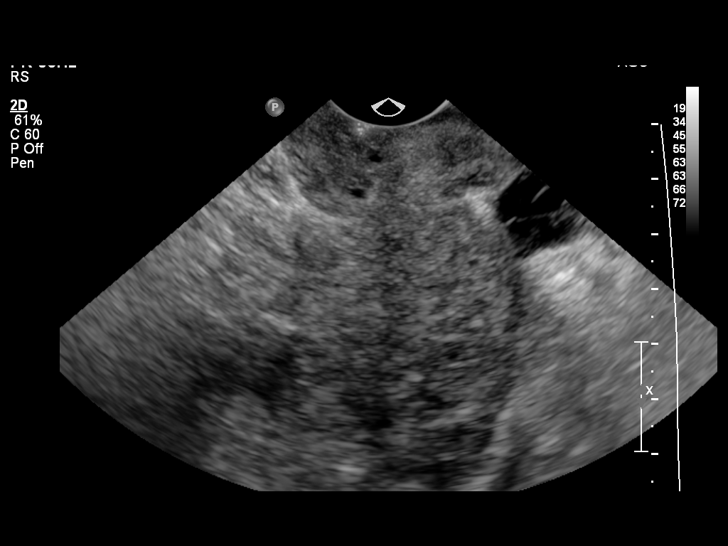
[im 26/70]
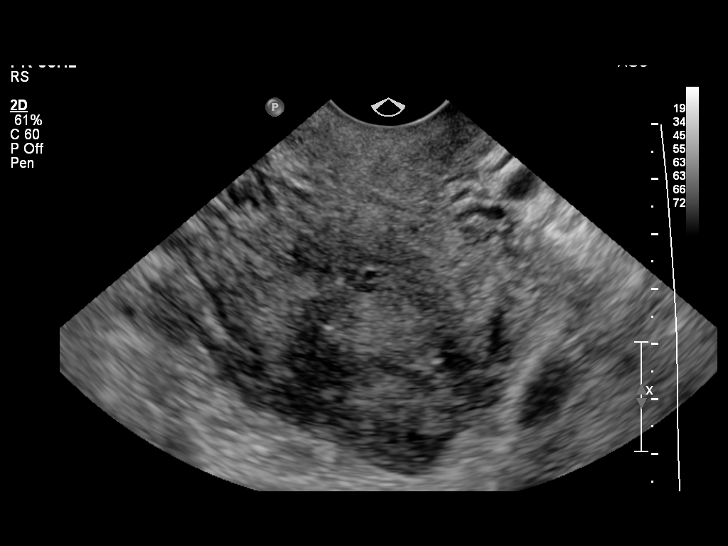
[im 32/70]
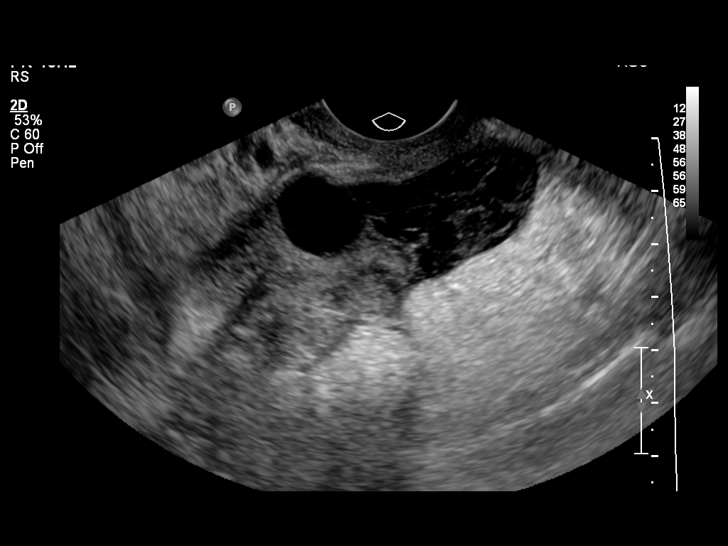
[im 38/70]
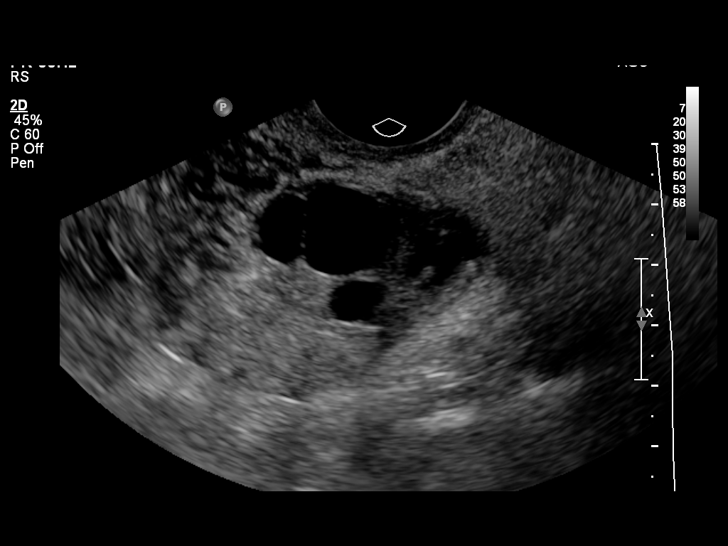
[im 44/70]
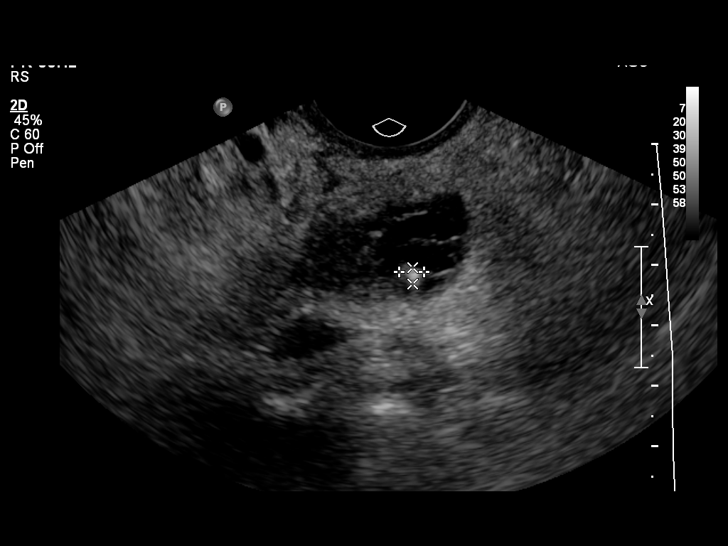
[im 47/70]
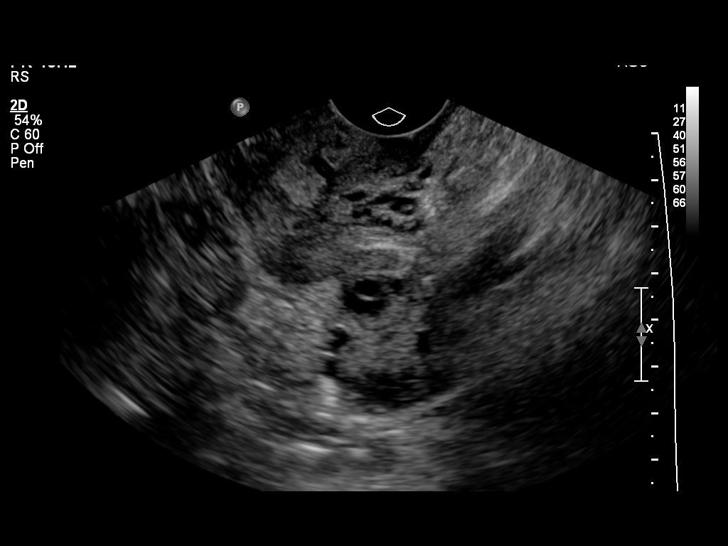
[im 52/70]
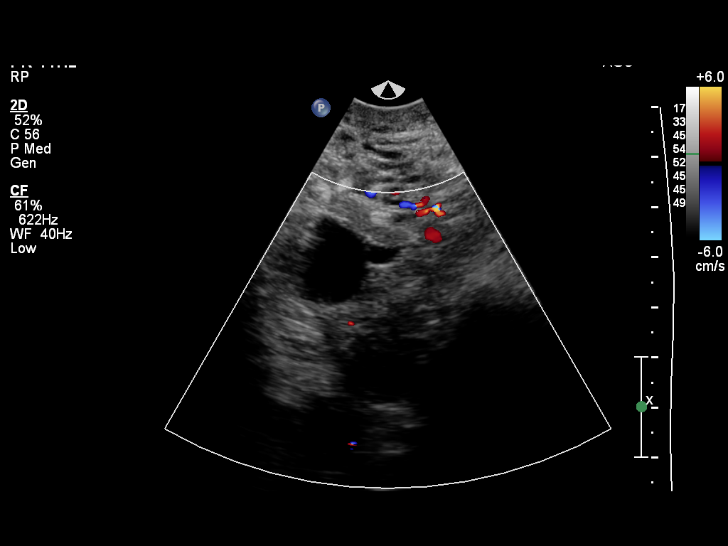
[im 58/70]
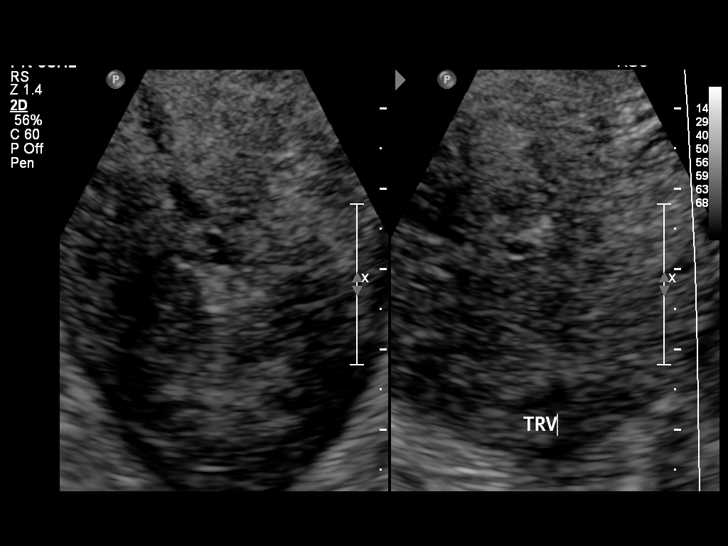
[im 64/70]
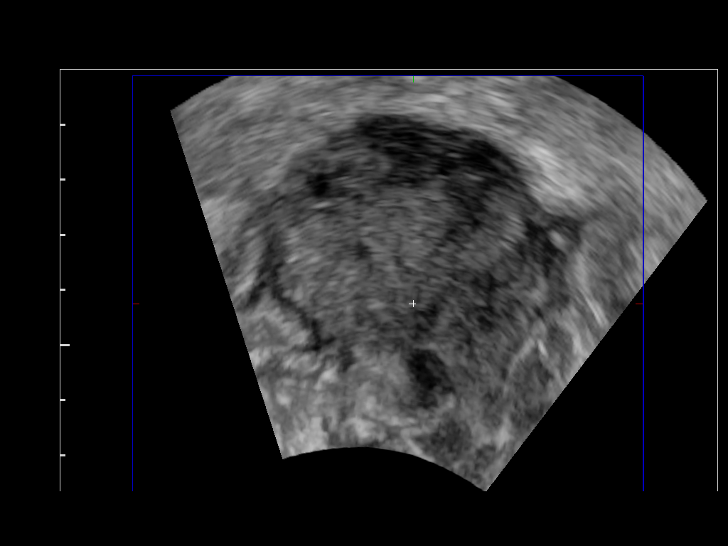
[im 70/70]
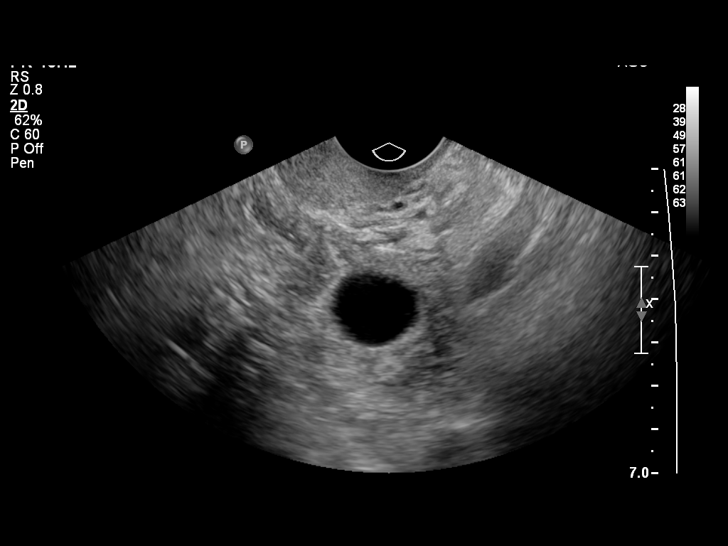

[14 of 25 positions shown; findings below may reference images not displayed]

FINDINGS: Uterus

Measurements: 7.9 x 3.9 x 5.6 cm. No fibroids or other mass
visualized.

Endometrium

Thickness: 5.2 mm.  No focal abnormality visualized.

Right ovary

Measurements: 7.1 x 3.3 x 4.1 cm. There is a 3.7 x 2.6 x 2.4 cm
complex cystic structure with multiple thin internal septations and
a focal area of hyper echogenicity.

Left ovary

Measurements: 4.4 x 2.5 x 3 cm. Normal appearance/no adnexal mass.

Other findings

No free fluid.
IMPRESSION: 1. Complex cystic right ovarian mass measuring 3.7 x 2.6 x 2.4 cm.
Recommend GYN consultation.
2. No uterine fibroids identified.

## 2015-04-25 IMAGING — CR DG CHEST 2V
2 series · 2 of 2 positions shown · non-contrast
Comparison: Two-view chest x-ray 04/14/2011, 02/21/2009,
12/10/2008. Prior [HOSPITAL] [HOSPITAL] and [facility].

CLINICAL DATA: Smoker with current history of COPD, presenting with
an exacerbation, cough and shortness of breath. Current history of
hepatitis C.

EXAM:
CHEST  2 VIEW

[w chest pa]
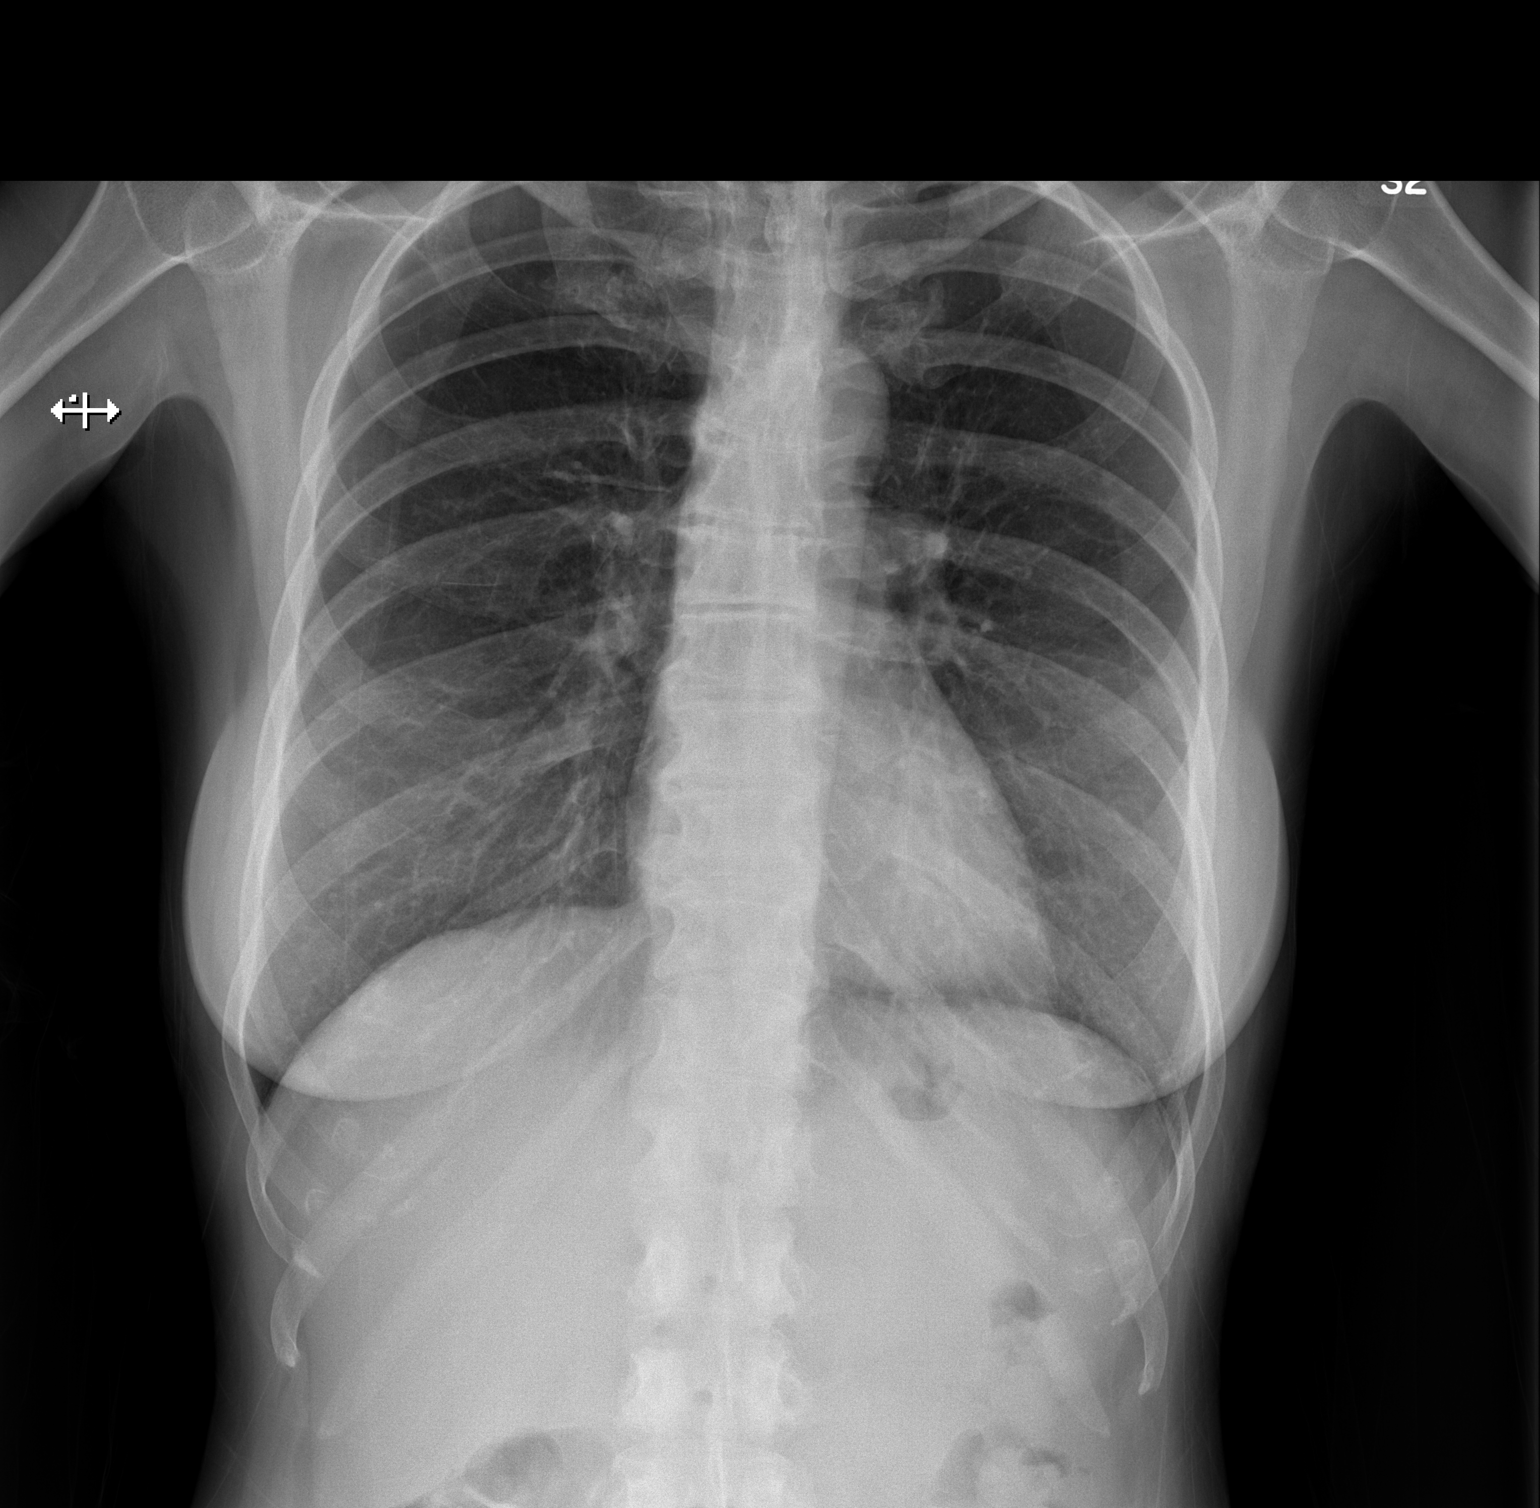

[w chest lat]
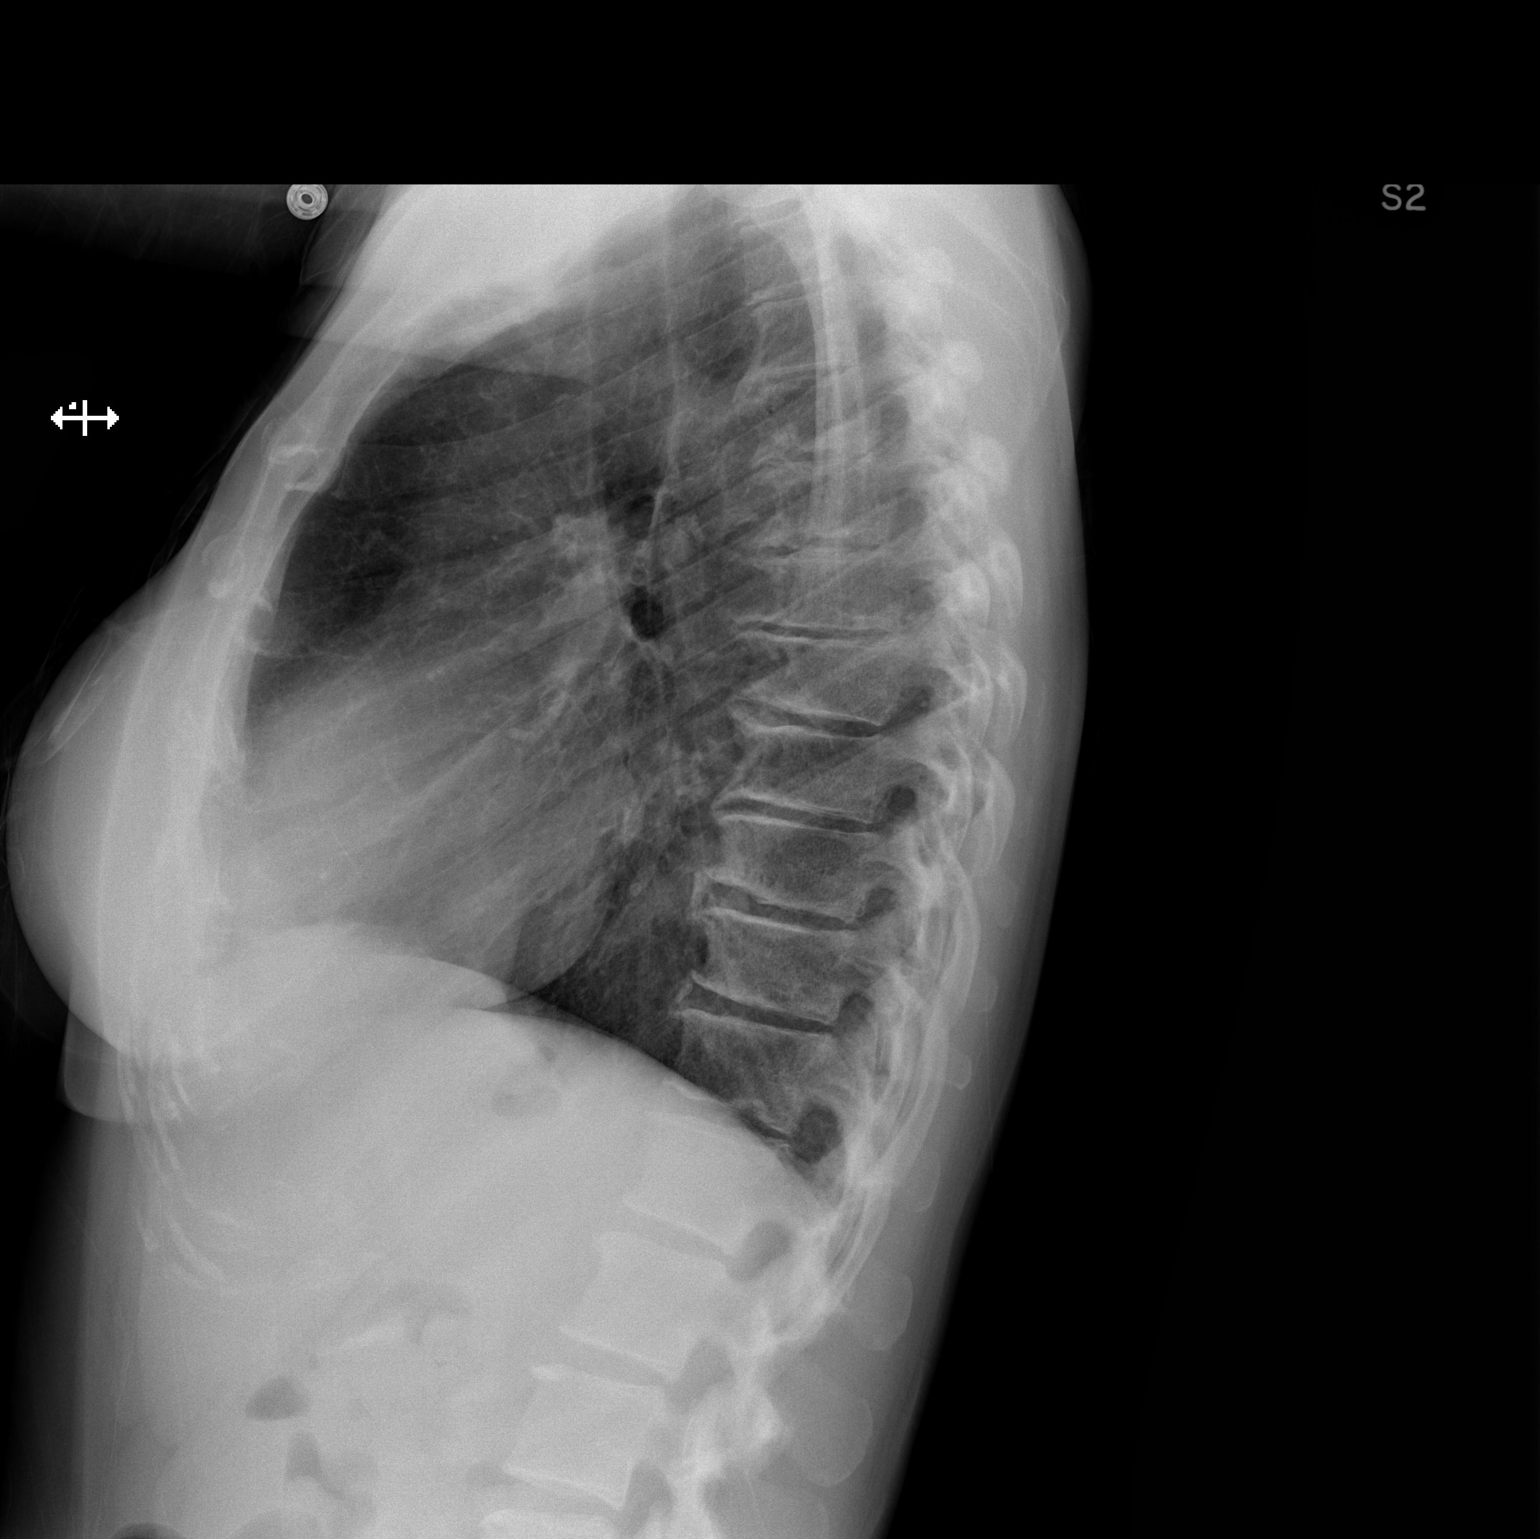

[2 of 2 positions shown; findings below may reference images not displayed]

FINDINGS: Cardiomediastinal silhouette unremarkable, unchanged. Emphysematous
changes in the upper lobes and mild hyperinflation, unchanged.
Moderate central peribronchial thickening, more so than on the prior
examinations. Lungs otherwise clear. No localized airspace
consolidation. No pleural effusions. No pneumothorax. Normal
pulmonary vascularity. Degenerative changes throughout the thoracic
spine.
IMPRESSION: Acute bronchitis superimposed upon COPD/emphysema. No evidence of
pneumonia.

## 2015-06-27 ENCOUNTER — Encounter: Payer: Self-pay | Admitting: Gastroenterology

## 2018-04-22 ENCOUNTER — Other Ambulatory Visit (HOSPITAL_COMMUNITY): Payer: Medicare Other

## 2018-04-22 ENCOUNTER — Other Ambulatory Visit: Payer: Self-pay
